# Patient Record
Sex: Male | Born: 1960 | Hispanic: No | Marital: Married | Smoking: Former smoker
Health system: Southern US, Community
[De-identification: ages and names within clinical notes are randomized; demographics above are authoritative.]

## PROBLEM LIST (undated history)

## (undated) DIAGNOSIS — T7840XA Allergy, unspecified, initial encounter: Secondary | ICD-10-CM

## (undated) DIAGNOSIS — M1990 Unspecified osteoarthritis, unspecified site: Secondary | ICD-10-CM

## (undated) DIAGNOSIS — M359 Systemic involvement of connective tissue, unspecified: Secondary | ICD-10-CM

## (undated) HISTORY — DX: Allergy, unspecified, initial encounter: T78.40XA

## (undated) HISTORY — DX: Unspecified osteoarthritis, unspecified site: M19.90

## (undated) HISTORY — PX: APPENDECTOMY: SHX54

## (undated) HISTORY — PX: SPINE SURGERY: SHX786

## (undated) HISTORY — PX: NECK SURGERY: SHX720

---

## 1997-11-28 ENCOUNTER — Encounter: Admission: RE | Admit: 1997-11-28 | Discharge: 1997-11-28 | Payer: Self-pay | Admitting: Family Medicine

## 1997-12-12 ENCOUNTER — Encounter: Admission: RE | Admit: 1997-12-12 | Discharge: 1997-12-12 | Payer: Self-pay | Admitting: Family Medicine

## 1997-12-14 ENCOUNTER — Encounter: Admission: RE | Admit: 1997-12-14 | Discharge: 1997-12-14 | Payer: Self-pay | Admitting: Family Medicine

## 2003-05-06 ENCOUNTER — Encounter: Admission: RE | Admit: 2003-05-06 | Discharge: 2003-05-06 | Payer: Self-pay | Admitting: Internal Medicine

## 2003-05-06 ENCOUNTER — Encounter: Payer: Self-pay | Admitting: Internal Medicine

## 2008-10-17 ENCOUNTER — Emergency Department (HOSPITAL_COMMUNITY): Admission: EM | Admit: 2008-10-17 | Discharge: 2008-10-17 | Payer: Self-pay | Admitting: Emergency Medicine

## 2010-03-08 ENCOUNTER — Emergency Department (HOSPITAL_COMMUNITY): Admission: EM | Admit: 2010-03-08 | Discharge: 2010-03-08 | Payer: Self-pay | Admitting: Family Medicine

## 2010-04-07 ENCOUNTER — Emergency Department (HOSPITAL_COMMUNITY): Admission: EM | Admit: 2010-04-07 | Discharge: 2010-04-07 | Payer: Self-pay | Admitting: Emergency Medicine

## 2010-07-01 ENCOUNTER — Emergency Department (HOSPITAL_COMMUNITY)
Admission: EM | Admit: 2010-07-01 | Discharge: 2010-07-01 | Payer: Self-pay | Source: Home / Self Care | Admitting: Family Medicine

## 2010-08-07 ENCOUNTER — Ambulatory Visit: Admit: 2010-08-07 | Payer: Self-pay | Admitting: Nurse Practitioner

## 2010-09-15 ENCOUNTER — Inpatient Hospital Stay (INDEPENDENT_AMBULATORY_CARE_PROVIDER_SITE_OTHER)
Admission: RE | Admit: 2010-09-15 | Discharge: 2010-09-15 | Disposition: A | Discharge status: Home / Self Care | Payer: Self-pay | Source: Ambulatory Visit | Attending: Family Medicine | Admitting: Family Medicine

## 2010-09-15 DIAGNOSIS — IMO0002 Reserved for concepts with insufficient information to code with codable children: Secondary | ICD-10-CM

## 2010-10-04 LAB — CBC
HCT: 37.5 % — ABNORMAL LOW (ref 39.0–52.0)
Hemoglobin: 12.9 g/dL — ABNORMAL LOW (ref 13.0–17.0)
MCH: 28.5 pg (ref 26.0–34.0)
MCHC: 34.4 g/dL (ref 30.0–36.0)
MCV: 83 fL (ref 78.0–100.0)
Platelets: 198 10*3/uL (ref 150–400)
RBC: 4.52 MIL/uL (ref 4.22–5.81)
RDW: 12.6 % (ref 11.5–15.5)
WBC: 5.8 10*3/uL (ref 4.0–10.5)

## 2010-10-04 LAB — DIFFERENTIAL
Basophils Absolute: 0 10*3/uL (ref 0.0–0.1)
Basophils Relative: 0 % (ref 0–1)
Eosinophils Absolute: 0.3 10*3/uL (ref 0.0–0.7)
Eosinophils Relative: 6 % — ABNORMAL HIGH (ref 0–5)
Lymphocytes Relative: 36 % (ref 12–46)
Lymphs Abs: 2.1 10*3/uL (ref 0.7–4.0)
Monocytes Absolute: 0.7 10*3/uL (ref 0.1–1.0)
Monocytes Relative: 12 % (ref 3–12)
Neutro Abs: 2.7 10*3/uL (ref 1.7–7.7)
Neutrophils Relative %: 46 % (ref 43–77)

## 2010-10-04 LAB — D-DIMER, QUANTITATIVE: D-Dimer, Quant: <0.22 ug/mL-FEU (ref 0.00–0.48)

## 2010-10-04 LAB — BASIC METABOLIC PANEL
BUN: 10 mg/dL (ref 6–23)
CO2: 29 mEq/L (ref 19–32)
Calcium: 9.1 mg/dL (ref 8.4–10.5)
Chloride: 104 mEq/L (ref 96–112)
Creatinine, Ser: 0.94 mg/dL (ref 0.4–1.5)
GFR calc Af Amer: >60 mL/min (ref >60)
GFR calc non Af Amer: >60 mL/min (ref >60)
Glucose, Bld: 100 mg/dL — ABNORMAL HIGH (ref 70–99)
Potassium: 3.8 mEq/L (ref 3.5–5.1)
Sodium: 137 mEq/L (ref 135–145)

## 2010-10-04 LAB — CK TOTAL AND CKMB (NOT AT ARMC)
CK, MB: 1.9 ng/mL (ref 0.3–4.0)
Relative Index: 0.5 (ref 0.0–2.5)
Total CK: 355 U/L — ABNORMAL HIGH (ref 7–232)

## 2010-10-05 LAB — POCT I-STAT, CHEM 8
Calcium, Ion: 1.21 mmol/L (ref 1.12–1.32)
Creatinine, Ser: 1 mg/dL (ref 0.4–1.5)
Glucose, Bld: 92 mg/dL (ref 70–99)
HCT: 46 % (ref 39.0–52.0)
Hemoglobin: 15.6 g/dL (ref 13.0–17.0)

## 2011-02-03 ENCOUNTER — Inpatient Hospital Stay (INDEPENDENT_AMBULATORY_CARE_PROVIDER_SITE_OTHER)
Admission: RE | Admit: 2011-02-03 | Discharge: 2011-02-03 | Disposition: A | Discharge status: Home / Self Care | Payer: Self-pay | Source: Ambulatory Visit | Attending: Emergency Medicine | Admitting: Emergency Medicine

## 2011-02-03 DIAGNOSIS — D179 Benign lipomatous neoplasm, unspecified: Secondary | ICD-10-CM

## 2011-02-03 DIAGNOSIS — R6889 Other general symptoms and signs: Secondary | ICD-10-CM

## 2011-09-03 ENCOUNTER — Ambulatory Visit (INDEPENDENT_AMBULATORY_CARE_PROVIDER_SITE_OTHER): Payer: BC Managed Care – PPO | Admitting: Physician Assistant

## 2011-09-03 DIAGNOSIS — M6282 Rhabdomyolysis: Secondary | ICD-10-CM

## 2011-09-03 DIAGNOSIS — M542 Cervicalgia: Secondary | ICD-10-CM

## 2011-09-03 DIAGNOSIS — M62838 Other muscle spasm: Secondary | ICD-10-CM

## 2011-09-03 DIAGNOSIS — IMO0001 Reserved for inherently not codable concepts without codable children: Secondary | ICD-10-CM

## 2011-09-03 DIAGNOSIS — L03019 Cellulitis of unspecified finger: Secondary | ICD-10-CM

## 2011-09-03 MED ORDER — MELOXICAM 7.5 MG PO TABS: 7.5000 mg | ORAL_TABLET | Freq: Every day | ORAL | Status: DC

## 2011-09-03 MED ORDER — DOXYCYCLINE HYCLATE 100 MG PO TABS: 100.0000 mg | ORAL_TABLET | Freq: Two times a day (BID) | ORAL | Status: AC

## 2011-09-03 MED ORDER — CYCLOBENZAPRINE HCL 5 MG PO TABS: 5.0000 mg | ORAL_TABLET | Freq: Three times a day (TID) | ORAL | Status: AC | PRN

## 2011-09-03 NOTE — Patient Instructions (Addendum)
Cervical Sprain and Strain °A cervical sprain is an injury to the neck. The injury can include either over-stretching or even small tears in the ligaments that hold the bones of the neck in place. A strain affects muscles and tendons. Minor injuries usually only involve ligaments and muscles. Because the different parts of the neck are so close together, more severe injuries can involve both sprain and strain. These injuries can affect the muscles, ligaments, tendons, discs, and nerves in the neck. °CAUSES  °An injury may be the result of a direct blow or from certain habits that can lead to the symptoms noted above. °· Injury from:  °· Contact sports (such as football, rugby, wrestling, hockey, auto racing, gymnastics, diving, martial arts, and boxing).  °· Motor vehicle accidents.  °· Whiplash injuries (see image at right). These are common. They occur when the neck is forcefully whipped or forced backward and/or forward.  °· Falls.  °· Lifestyle or awkward postures:  °· Cradling a telephone between the ear and shoulder.  °· Sitting in a chair that offers no support.  °· Working at an ill-designed computer station.  °· Activities that require hours of repeated or long periods of looking up (stretching the neck backward) or looking down (bending the head/neck forward).  °SYMPTOMS  °· Pain, soreness, stiffness, or burning sensation in the front, back, or sides of the neck. This may develop immediately after injury. Onset of discomfort may also develop slowly and not begin for 24 hours or more.  °· Shoulder and/or upper back pain.  °· Limits to the normal movement of the neck.  °· Headache.  °· Dizziness.  °· Weakness and/or abnormal sensation (such as numbness or tingling) of one or both arms and/or hands.  °· Muscle spasm.  °· Difficulty with swallowing or chewing.  °· Tenderness and swelling at the injury site.  °DIAGNOSIS  °Most of the time, your caregiver can diagnose this problem with a careful history and  examination. The history will include information about known problems (such as arthritis in the neck) or a previous neck injury. X-rays may be ordered to find out if there is a different problem. X-rays can also help to find problems with the bones of the neck not related to the injury or current symptoms. °TREATMENT  °Several treatment options are available to help pain, spasm, and other symptoms. They include: °· Cold helps relieve pain and reduce inflammation. Cold should be applied for 10 to 15 minutes every 2 to 3 hours after any activity that aggravates your symptoms. Use ice packs or an ice massage. Place a towel or cloth in between your skin and the ice pack.  °· Medication:  °· Only take over-the-counter or prescription medicines for pain, discomfort, or fever as directed by your caregiver.  °· Pain relievers or muscle relaxants may be prescribed. Use only as directed and only as much as you need.  °· Change in the activity that caused the problem. This might include using a headset with a telephone so that the phone is not propped between your ear and shoulder.  °· Neck collar. Your caregiver may recommend temporary use of a soft cervical collar.  °· Work station. Changes may be needed in your work place. A better sitting position and/or better posture during work may be part of your treatment.  °· Physical Therapy. Your caregiver may recommend physical therapy. This can include instructions in the use of stretching and strengthening exercises. Improvement in posture is important.   Exercises and posture training can help stabilize the neck and strengthen muscles and keep symptoms from returning.  °HOME CARE INSTRUCTIONS  °Other than formal physical therapy, all treatments above can be done at home. Even when not at work, it is important to be conscious of your posture and of activities that can cause a return of symptoms. °Most cervical sprains and/or strains are better in 1-3 weeks. As you improve and  increase activities, doing a warm up and stretching before the activity will help prevent recurrent problems. °SEEK MEDICAL CARE IF:  °· Pain is not effectively controlled with medication.  °· You feel unable to decrease pain medication over time as planned.  °· Activity level is not improving as planned and/or expected.  °SEEK IMMEDIATE MEDICAL CARE IF:  °· While using medication, you develop any bleeding, stomach upset, or signs of an allergic reaction.  °· Symptoms get worse, become intolerable, and are not helped by medications.  °· New, unexplained symptoms develop.  °· You experience numbness, tingling, weakness, or paralysis of any part of your body.  °MAKE SURE YOU:  °· Understand these instructions.  °· Will watch your condition.  °· Will get help right away if you are not doing well or get worse.  °Document Released: 05/05/2007 Document Revised: 03/20/2011 Document Reviewed: 05/05/2007 °[employer]® Patient Information ©2012 [employer]. °

## 2011-09-03 NOTE — Progress Notes (Signed)
  Subjective:    Patient ID: Jay Osborn, male    DOB: 04/01/1961, 51 y.o.   MRN: [id number]  Neck Pain  This is a chronic problem. The current episode started more than 1 month ago. The problem occurs constantly. The problem has been unchanged. Associated with: works with Scientist, research (life sciences). The pain is present in the left side. The quality of the pain is described as aching. The pain is moderate. The symptoms are aggravated by bending and position. The pain is same all the time. Pertinent negatives include no tingling. He has tried nothing for the symptoms. The treatment provided no relief.   Pt has noticed L trapezius pain for over 6 months.  It is worse after working but never really goes away, now having pain in his vertebrae near the area of pain.  He works with Fish farm manager.  He has tried no meds.  No paresthesias or weakness in the L arm.  He is R hand dominate.  No specific injury he knows of.  He also has an infection in the medial aspect of his R 4th digit around the nail after he got poked at his proximal nail fold.  He has noticed increased pain and what looks like pus collection.    Review of Systems  HENT: Positive for neck pain.   Neurological: Negative for tingling.       Objective:   Physical Exam  Constitutional: He is oriented to person, place, and time. He appears well-developed and well-nourished.  HENT:  Head: Normocephalic and atraumatic.  Right Ear: External ear normal.  Left Ear: External ear normal.  Pulmonary/Chest: Effort normal.  Musculoskeletal:       Cervical back: He exhibits tenderness, bony tenderness, pain and spasm (L trapezius area swollen and TTP). He exhibits normal range of motion.       Back:       Hands: Neurological: He is alert and oriented to person, place, and time. He has normal strength and normal reflexes. No sensory deficit.  Reflex Scores:      Tricep reflexes are 2+ on the left  side.      Bicep reflexes are 2+ on the right side and 2+ on the left side.      Brachioradialis reflexes are 2+ on the right side and 2+ on the left side. Skin: Skin is warm and dry.    Procedure: ETOH swab - 18g needle to I&D with yellow purulent d/c, sent for wound cx.      Assessment & Plan:   1. Paronychia of fourth finger of right hand  doxycycline (VIBRA-TABS) 100 MG tablet, Wound culture  2. Spasm of muscle  cyclobenzaprine (FLEXERIL) 5 MG tablet, meloxicam (MOBIC) 7.5 MG tablet  3. Neck pain on left side  cyclobenzaprine (FLEXERIL) 5 MG tablet, meloxicam (MOBIC) 7.5 MG tablet   Pt to use warm compresses for finger.

## 2011-09-06 LAB — WOUND CULTURE

## 2011-11-11 ENCOUNTER — Ambulatory Visit (INDEPENDENT_AMBULATORY_CARE_PROVIDER_SITE_OTHER): Payer: BC Managed Care – PPO | Admitting: Internal Medicine

## 2011-11-11 ENCOUNTER — Ambulatory Visit: Payer: BC Managed Care – PPO

## 2011-11-11 VITALS — BP 125/79 | HR 91 | Temp 98.7°F | Resp 16 | Ht 68.0 in | Wt 155.8 lb

## 2011-11-11 DIAGNOSIS — M542 Cervicalgia: Secondary | ICD-10-CM

## 2011-11-11 DIAGNOSIS — M5030 Other cervical disc degeneration, unspecified cervical region: Secondary | ICD-10-CM

## 2011-11-11 DIAGNOSIS — M546 Pain in thoracic spine: Secondary | ICD-10-CM

## 2011-11-11 MED ORDER — MELOXICAM 15 MG PO TABS: 15.0000 mg | ORAL_TABLET | Freq: Every day | ORAL | Status: DC
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
## 2011-11-11 NOTE — Patient Instructions (Signed)
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 Yevette Edwards for further care/take your x-rays when you go Where a cervical collar for relief of pain while you're sitting to work or driving

## 2012-03-22 ENCOUNTER — Ambulatory Visit (INDEPENDENT_AMBULATORY_CARE_PROVIDER_SITE_OTHER): Payer: BC Managed Care – PPO | Admitting: Emergency Medicine

## 2012-03-22 ENCOUNTER — Ambulatory Visit: Payer: BC Managed Care – PPO

## 2012-03-22 VITALS — BP 112/80 | HR 82 | Temp 98.9°F | Resp 16 | Ht 68.0 in | Wt 153.6 lb

## 2012-03-22 DIAGNOSIS — M545 Low back pain, unspecified: Secondary | ICD-10-CM

## 2012-03-22 LAB — POCT UA - MICROSCOPIC ONLY
Bacteria, U Microscopic: NEGATIVE
Casts, Ur, LPF, POC: NEGATIVE
Crystals, Ur, HPF, POC: NEGATIVE
Epithelial cells, urine per micros: NEGATIVE
Yeast, UA: NEGATIVE

## 2012-03-22 MED ORDER — MELOXICAM 15 MG PO TABS: 15.0000 mg | ORAL_TABLET | Freq: Every day | ORAL | Status: AC

## 2012-03-22 MED ORDER — CYCLOBENZAPRINE HCL 10 MG PO TABS: ORAL_TABLET | ORAL | Status: DC

## 2012-03-22 NOTE — Progress Notes (Signed)
  Subjective:    Patient ID: Jay Osborn, male    DOB: Apr 26, 1961, 51 y.o.   MRN: [id number]  HPI patient states that one week ago he developed severe discomfort in his low back. He denies being sexually active with his wife and had been sexually inactive for approximately 6 months. He recently had surgery by Dr. Marissa Nestle for cervical disease. He denies any radicular symptoms down his legs. He denies any weakness in his legs. His pain seems to be localized over the mid to lower lumbar spine.    Review of Systems     Objective:   Physical Exam there is no CVA pain. There is tenderness to palpation in the paralumbar muscles from L2-L5 bilaterally . Motor strength is 5 out of 5 all muscle groups. His prostate exam reveals a normal-sized prostate lobes are smooth there are no nodules palpable.  [facility] reading (PRIMARY) by  Dr. Cleta Alberts these no abnormality seen of the LS-spine. Results for orders placed in visit on 03/22/12  POCT UA - MICROSCOPIC ONLY      Component Value Range   WBC, Ur, HPF, POC 0-2     RBC, urine, microscopic 0-2     Bacteria, U Microscopic NEG     Mucus, UA SMALL     Epithelial cells, urine per micros NEG     Crystals, Ur, HPF, POC NEG     Casts, Ur, LPF, POC NEG     Yeast, UA NEG           Assessment & Plan:  This appears to be an LS strain. We will treat with Mobic and muscle relaxers. He was given a back manual.

## 2013-03-23 ENCOUNTER — Other Ambulatory Visit: Payer: Self-pay | Admitting: Physician Assistant

## 2013-03-23 ENCOUNTER — Ambulatory Visit (INDEPENDENT_AMBULATORY_CARE_PROVIDER_SITE_OTHER): Payer: BC Managed Care – PPO | Admitting: Internal Medicine

## 2013-03-23 VITALS — BP 118/70 | HR 68 | Temp 98.2°F | Resp 18 | Ht 68.5 in | Wt 151.7 lb

## 2013-03-23 DIAGNOSIS — L821 Other seborrheic keratosis: Secondary | ICD-10-CM

## 2013-03-23 DIAGNOSIS — L299 Pruritus, unspecified: Secondary | ICD-10-CM

## 2013-03-23 LAB — COMPREHENSIVE METABOLIC PANEL
Albumin: 4.4 g/dL (ref 3.5–5.2)
BUN: 10 mg/dL (ref 6–23)
Calcium: 9.3 mg/dL (ref 8.4–10.5)
Chloride: 102 mEq/L (ref 96–112)
Creat: 1.14 mg/dL (ref 0.50–1.35)
Glucose, Bld: 85 mg/dL (ref 70–99)
Potassium: 4.2 mEq/L (ref 3.5–5.3)

## 2013-03-23 LAB — CBC WITH DIFFERENTIAL/PLATELET
Eosinophils Relative: 5 % (ref 0–5)
HCT: 40.3 % (ref 39.0–52.0)
Hemoglobin: 14.2 g/dL (ref 13.0–17.0)
Lymphocytes Relative: 40 % (ref 12–46)
MCHC: 35.2 g/dL (ref 30.0–36.0)
MCV: 82.1 fL (ref 78.0–100.0)
Monocytes Absolute: 0.5 10*3/uL (ref 0.1–1.0)
Monocytes Relative: 10 % (ref 3–12)
Neutro Abs: 2.2 10*3/uL (ref 1.7–7.7)
RDW: 13.1 % (ref 11.5–15.5)
WBC: 4.8 10*3/uL (ref 4.0–10.5)

## 2013-03-23 NOTE — Patient Instructions (Addendum)
I will let you know when your lab work and pathology report are back, and what, if anything, we need to do at that time.  For now, treat the itching with Zyrtec or Claritin (these will not make you drowsy, so ok to use when working), and take Benadryl before you go to sleep.

## 2013-03-23 NOTE — Progress Notes (Signed)
Subjective:    Patient ID: Oris Drone, male    DOB: [birth year], 52 y.o.   MRN: [id number]  HPI   Mr. Kloster is a very pleasant 52 yr old male here with concern for illness.  He has been itchy for the last week - primarily back, abdomen, chest, arms.  His wife was going to apply otc itch cream to his back but noticed lots of small dark bumps across these areas.  Pt reports they look like moles but are all new within the last week.  He denies pain.  The individual lesions are not necessarily itchy, but his skin in those areas is itchy.  He ended up not applying the itch cream due to wanting to have the bumps checked out first.  He has not used anything for itching.  No one else has similar symptoms.  He denies any associated symptoms including fever, chills, cough, weight loss, abd pain, NVD.  He has no other involved skin.  There is no involvement of palms or soles.  No oral involvement.  He works 3rd shift so is typically sleeping during the day - no sun exposure.  He denies insect bites and reports the areas do not look like bites.  He denies contact with poison ivy or other plants.  Denies new soaps, detergents, lotions, clothing.  Denies any known allergies.  Not using any medications currently.  Reports that he has been in good health.  He is a former smoker.   Review of Systems  Constitutional: Negative for fever, chills, activity change, appetite change, fatigue and unexpected weight change.  HENT: Negative.   Respiratory: Negative.   Cardiovascular: Negative.   Gastrointestinal: Negative.   Skin:       Itching, dark spots  Neurological: Negative.        Objective:   Physical Exam  Vitals reviewed. Constitutional: He is oriented to person, place, and time. He appears well-developed and well-nourished. No distress.  HENT:  Head: Normocephalic and atraumatic.  Neck: Neck supple. No thyromegaly present.  Cardiovascular: Normal rate, regular rhythm and normal heart sounds.    Pulmonary/Chest: Effort normal and breath sounds normal. He has no wheezes. He has no rales.  Abdominal: Soft. Bowel sounds are normal. There is no tenderness.  Lymphadenopathy:    He has no cervical adenopathy.  Neurological: He is alert and oriented to person, place, and time.  Skin: Skin is warm and dry.  Multiple dark lesions scattered across back, chest, abdomen, sides, and face; approx 1-3mm in size; few lesions are slightly raised with stuck on appearance - appear c/w with seborrheic keratoses; no vesicles or pustules; no erythema; no involvement of lower extremities, palms, or soles  Psychiatric: He has a normal mood and affect. His behavior is normal.        Assessment & Plan:  Seborrheic keratoses - Plan: CBC with Differential, Comprehensive metabolic panel, TSH, Dermatology pathology  Pruritus - Plan: CBC with Differential, Comprehensive metabolic panel, TSH, Dermatology pathology    Mr. Cruickshank is a very pleasant 52 yr old male with 1 week of generalized pruritus associated with the development of multiple new seborrheic keratoses.  He otherwise feels well and exam is otherwise normal.  The sudden appearance of the lesions is somewhat concerning for underlying systemic illness or malignancy.  Will do CBC, CMP, TSH today.  I have also shaved one lesion and sent for derm path.  Will follow up on labs and pathology and determine further work up from there.  Pt understands and is in agreement with this plan. In the mean time, can use otc antihistamines for itch relief.     Case discussed with Dr. Merla Riches   I evaluated this patient including examining his rash/the etiology is not clear and is concerning that the rash appeared so abruptly over such an extensive surface I have reviewed and agree with documentation. Robert P. Merla Riches, M.D.

## 2013-03-24 ENCOUNTER — Telehealth: Payer: Self-pay | Admitting: Physician Assistant

## 2013-03-24 LAB — TSH: TSH: 3.168 u[IU]/mL (ref 0.350–4.500)

## 2013-03-24 NOTE — Telephone Encounter (Signed)
LMTCB to discuss labs.

## 2013-03-25 ENCOUNTER — Telehealth: Payer: Self-pay | Admitting: Physician Assistant

## 2013-03-25 ENCOUNTER — Telehealth: Payer: Self-pay

## 2013-03-25 LAB — PSA: PSA: 1.69 ng/mL (ref <=4.00)

## 2013-03-25 NOTE — Telephone Encounter (Signed)
Tried listed home number again, goes straight to VM with no identifying information - LMTCB again.  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

## 2013-03-25 NOTE — Telephone Encounter (Signed)
Error

## 2013-03-25 NOTE — Telephone Encounter (Signed)
Tried to call pt again re: labs and coming back in to see me.  No answer at home number.  No answer at work number.  Will try again later today

## 2013-06-01 ENCOUNTER — Ambulatory Visit (INDEPENDENT_AMBULATORY_CARE_PROVIDER_SITE_OTHER): Payer: BC Managed Care – PPO | Admitting: Family Medicine

## 2013-06-01 ENCOUNTER — Ambulatory Visit: Payer: BC Managed Care – PPO

## 2013-06-01 VITALS — BP 118/76 | HR 66 | Temp 98.0°F | Resp 16 | Ht 69.5 in | Wt 157.0 lb

## 2013-06-01 DIAGNOSIS — R3129 Other microscopic hematuria: Secondary | ICD-10-CM

## 2013-06-01 DIAGNOSIS — R109 Unspecified abdominal pain: Secondary | ICD-10-CM

## 2013-06-01 LAB — POCT CBC
HCT, POC: 44.5 % (ref 43.5–53.7)
Hemoglobin: 13.9 g/dL — AB (ref 14.1–18.1)
Lymph, poc: 1.9 (ref 0.6–3.4)
MCH, POC: 28.3 pg (ref 27–31.2)
MCV: 90.5 fL (ref 80–97)
RBC: 4.92 M/uL (ref 4.69–6.13)
WBC: 4.6 10*3/uL (ref 4.6–10.2)

## 2013-06-01 LAB — COMPREHENSIVE METABOLIC PANEL
ALT: 10 U/L (ref 0–53)
CO2: 28 mEq/L (ref 19–32)
Calcium: 9 mg/dL (ref 8.4–10.5)
Chloride: 102 mEq/L (ref 96–112)
Creat: 0.9 mg/dL (ref 0.50–1.35)
Glucose, Bld: 85 mg/dL (ref 70–99)

## 2013-06-01 LAB — POCT URINALYSIS DIPSTICK
Bilirubin, UA: NEGATIVE
Blood, UA: NEGATIVE
Glucose, UA: NEGATIVE
Leukocytes, UA: NEGATIVE
Nitrite, UA: NEGATIVE

## 2013-06-01 LAB — POCT UA - MICROSCOPIC ONLY
Mucus, UA: POSITIVE
Yeast, UA: NEGATIVE

## 2013-06-01 MED ORDER — TAMSULOSIN HCL 0.4 MG PO CAPS: 0.4000 mg | ORAL_CAPSULE | Freq: Every day | ORAL | Status: DC

## 2013-06-01 NOTE — Patient Instructions (Signed)
Take 1 flomax fill a day for the next week or so. Use the urine filter to see if you can catch a stone.  If your pain does not get better please let me know- Sooner if worse.   I will be in touch with the rest of your labs

## 2013-06-01 NOTE — Progress Notes (Addendum)
[facility] [address], [place] [address] [phone]  Date:  06/01/2013   Name:  Jay Osborn   DOB:  11/30/1960   MRN:  [id number]  PCP:  No primary provider on file.    Chief Complaint: Flank Pain   History of Present Illness:  Jay Osborn is a 52 y.o. very pleasant male patient who presents with the following:  He has noted intermittent pain in his right side for about one week.   He works 3rd shift.  Last night he felt a 'hot" feeling and a "sting" in his right side.  His discomfort will come and go.   No dysuria.   He has not noted any hematuria.   He has not had any nausea or vomiting, he is eating ok.  No fever.  No systemic symptoms such as fever or aches.   He has never had this in the past.   He has never been dx with a kidney stone.  He has had neck surgery- no abdominal operations in the past.   He is having normal stools- had a BM this morning.    He did smoke for a little over a year in the past- he is long quit  There are no active problems to display for this patient.   No past medical history on file.  No past surgical history on file.  History  Substance Use Topics  . Smoking status: Former Games developer  . Smokeless tobacco: Not on file  . Alcohol Use: No    Family History  Problem Relation Age of Onset  . Cancer Father     No Known Allergies  Medication list has been reviewed and updated.  No current outpatient prescriptions on file prior to visit.   No current facility-administered medications on file prior to visit.    Review of Systems:  As per HPI- otherwise negative.   Physical Examination: Filed Vitals:   06/01/13 0851  BP: 118/76  Pulse: 66  Temp: 98 F (36.7 C)  Resp: 16   Filed Vitals:   06/01/13 0851  Height: 5' 9.5" (1.765 m)  Weight: 157 lb (71.215 kg)   Body mass index is 22.86 kg/(m^2). Ideal Body Weight: Weight in (lb) to have BMI = 25: 171.4  GEN: WDWN, NAD, Non-toxic, A  & O x 3, normal weight, looks well HEENT: Atraumatic, Normocephalic. Neck supple. No masses, No LAD. Ears and Nose: No external deformity. CV: RRR, No M/G/R. No JVD. No thrill. No extra heart sounds. PULM: CTA B, no wheezes, crackles, rhonchi. No retractions. No resp. distress. No accessory muscle use. ABD: S, NT, ND, +BS. No rebound. No HSM. He indicates a tender area on his right side (lateral aspect of the abdomen) just under the inferior ribs. This is somewhat difficult to locate and does not appear to be overly tender.  Negative murphy sign.  No RLQ tenderness, no CBA tenderness  EXTR: No c/c/e NEURO Normal gait.  PSYCH: Normally interactive. Conversant. Not depressed or anxious appearing.  Calm demeanor.   [facility] reading (PRIMARY) by  Dr. Patsy Lager. abd series: normal.  Some increased stool in the right colon  ACUTE ABDOMEN SERIES (ABDOMEN 2 VIEW & CHEST 1 VIEW)  COMPARISON: None.  FINDINGS: There is no evidence of dilated bowel loops or free intraperitoneal air. No radiopaque calculi or other significant radiographic abnormality is seen.  Heart size and mediastinal contours are within normal limits. Both lungs are clear.  IMPRESSION: Negative abdominal  radiographs. No acute cardiopulmonary disease.   Results for orders placed in visit on 06/01/13  POCT CBC      Result Value Range   WBC 4.6  4.6 - 10.2 K/uL   Lymph, poc 1.9  0.6 - 3.4   POC LYMPH PERCENT 41.2  10 - 50 %L   MID (cbc) 0.5  0 - 0.9   POC MID % 11.1  0 - 12 %M   POC Granulocyte 2.2  2 - 6.9   Granulocyte percent 47.7  37 - 80 %G   RBC 4.92  4.69 - 6.13 M/uL   Hemoglobin 13.9 (*) 14.1 - 18.1 g/dL   HCT, POC 21.3  08.6 - 53.7 %   MCV 90.5  80 - 97 fL   MCH, POC 28.3  27 - 31.2 pg   MCHC 31.2 (*) 31.8 - 35.4 g/dL   RDW, POC 57.8     Platelet Count, POC 212  142 - 424 K/uL   MPV    0 - 99.8 fL  POCT UA - MICROSCOPIC ONLY      Result Value Range   WBC, Ur, HPF, POC 0-2     RBC, urine, microscopic 0-3      Bacteria, U Microscopic trace     Mucus, UA pos     Epithelial cells, urine per micros 0-1     Crystals, Ur, HPF, POC neg     Casts, Ur, LPF, POC neg     Yeast, UA neg    POCT URINALYSIS DIPSTICK      Result Value Range   Color, UA yellow     Clarity, UA clear     Glucose, UA neg     Bilirubin, UA neg     Ketones, UA neg     Spec Grav, UA >=1.030     Blood, UA neg     pH, UA 5.5     Protein, UA neg     Urobilinogen, UA 0.2     Nitrite, UA neg     Leukocytes, UA Negative      Assessment and Plan: Right flank pain - Plan: DG Abd Acute W/Chest, POCT CBC, Comprehensive metabolic panel, POCT UA - Microscopic Only, POCT urinalysis dipstick, tamsulosin (FLOMAX) 0.4 MG CAPS capsule  Microhematuria - Plan: Urine culture  Possible renal stone causing vague discomfort vs MSK pain.  Will start flomax, check urine culture.  Offered CT and discussed benefit of discovering any pathology vs drawback of expense and radiation.  At this time he declines CT scan.  Will strain his urine, let me know if not feeling better in a day or two.  If any worsening or other concerns he is to get help right away.   Signed Abbe Amsterdam, MD  11/16: received culture; negative.  Called and LMOM.  I will go ahead and refer to urology for evaluation.

## 2013-06-07 ENCOUNTER — Encounter: Payer: Self-pay | Admitting: Family Medicine

## 2013-06-07 NOTE — Addendum Note (Signed)
Addended by: Abbe Amsterdam C on: 06/07/2013 03:48 PM   Modules accepted: Orders

## 2013-06-22 ENCOUNTER — Ambulatory Visit (INDEPENDENT_AMBULATORY_CARE_PROVIDER_SITE_OTHER): Payer: BC Managed Care – PPO | Admitting: Family Medicine

## 2013-06-22 VITALS — BP 112/68 | HR 67 | Temp 98.6°F | Resp 17 | Ht 69.0 in | Wt 158.0 lb

## 2013-06-22 DIAGNOSIS — B370 Candidal stomatitis: Secondary | ICD-10-CM

## 2013-06-22 MED ORDER — FLUCONAZOLE 100 MG PO TABS: 100.0000 mg | ORAL_TABLET | Freq: Every day | ORAL | Status: DC

## 2013-06-22 NOTE — Patient Instructions (Signed)
Thrush, Adult  Thrush is a yeast infection that develops in the mouth and throat and on the tongue. The medical term for this is oropharyngeal candidiasis, or OPC. Thrush is most common in older adults, but it can occur at any age. Thrush occurs when a yeast called candida grows out of control. Candida normally is present in small amounts in the mouth and on other mucous membranes. However, under certain circumstances, candida can grow rapidly, causing thrush. Thrush can be a recurring problem for people who have chronic illnesses or who take medications that limit the body's ability to fight infection (weakened immune system). Since these people have difficulty fighting infections, the fungus that causes thrush can spread throughout the body. This can cause life-threatening blood or organ infections. CAUSES  Candida, the yeast that causes thrush, is normally present in small amounts in the mouth and on other mucous membranes. It usually causes no harm. However, when conditions are present that allow the yeast to grow uncontrolled, it invades surrounding tissues and becomes an infection. Thrush is most commonly caused by the yeast Candida albicans. Less often, other forms of candida can lead to thrush. There are many types of bacteria in your mouth that normally control the growth of candida. Sometimes a new type of bacteria gets into your mouth and disrupts the balance of the germs already there. This can allow candida to overgrow. Other factors that increase your risk of developing thrush include:  An impaired ability to fight infection (weakened immune system). A normal immune system is usually strong enough to prevent candida from overgrowing.  Older adults are more likely to develop thrush because they may have weaker immune systems.  People with human immunodeficiency virus (HIV) infection have a high likelihood of developing thrush. About 90% of people with HIV develop thrush at some point during  the course of their disease.  People with diabetes are more likely to get thrush because high blood sugar levels promote overgrowth of the candida fungus.  A dry mouth (xerostomia). Dry mouth can result from overuse of mouthwashes or from certain conditions such as Sjgren's syndrome.  Pregnancy. Hormone changes during pregnancy can lead to thrush by altering the balance of bacteria in the mouth.  Poor dental care, especially in people who have false teeth.  The use of antibiotic medications. This may lead to thrush by changing the balance of bacteria in the mouth. SYMPTOMS  Thrush can be a mild infection that causes no symptoms. If symptoms develop, they may include the following:  A burning feeling in the mouth and throat. This can occur at the start of a thrush infection.  White patches that adhere to the mouth and tongue. The tissue around the patches may be red, raw, and painful. If rubbed (during tooth brushing, for example), the patches and the tissue of the mouth may bleed easily.  A bad taste in the mouth or difficulty tasting foods.  Cottony feeling in the mouth.  Sometimes pain during eating and swallowing. DIAGNOSIS  Your caregiver can usually diagnose thrush by exam. In addition to looking in your mouth, your caregiver will ask you questions about your health. TREATMENT  Medications that help prevent the growth of fungi (antifungals) are the standard treatment for thrush. These medications are either applied directly to the affected area (topical) or swallowed (oral). Mild thrush In adults, mild cases of thrush may clear up with simple treatment that can be done at home. This treatment usually involves using an antifungal mouth   rinse or lozenges. Treatment usually lasts about 14 days. Moderate to severe thrush  More severe thrush infections that have spread to the esophagus are treated with an oral antifungal medication. A topical antifungal medication may also be  used.  For some severe infections, a treatment period longer than 14 days may be needed.  Oral antifungal medications are almost never used during pregnancy because the fetus may be harmed. However, if a pregnant woman has a rare, severe thrush infection that has spread to her blood, oral antifungal medications may be used. In this case, the risk of harm to the mother and fetus from the severe thrush infection may be greater than the risk posed by the use of antifungal medications. Persistent or recurrent thrush Persistent (does not go away) or recurrent (keeps coming back) cases of thrush may:  Need to be treated twice as long as the symptoms last.  Require treatment with both oral and topical antifungal medications.  People with weakened immune systems can take an antifungal medication on a continuous basis to prevent thrush infections. It is important to treat conditions that make you more likely to get thrush, such as diabetes, human immunodeficiency virus (HIV), or cancer.  HOME CARE INSTRUCTIONS   If you are breast-feeding, you should clean your nipples with an antifungal medication, such as nystatin (Mycostatin). Dry your nipples after breast-feeding. Applying lanolin-containing body lotion may help relieve nipple soreness.  If you wear dentures and get thrush, remove dentures before going to bed, brush them vigorously, and soak in a solution of chlorhexidine gluconate or a product such as Polident or Efferdent.  Eating plain, unflavored yogurt that contains live cultures (check the label) can also help cure thrush. Yogurt helps healthy bacteria grow in the mouth. These bacteria stop the growth of the yeast that causes thrush.  Adults can treat thrush at home with gentian violet (1%), a dye that kills bacteria and fungi. It is available without a prescription. If there is no known cause for the infection or if gentian violet does not cure the thrush, you need to see your  caregiver. Comfort measures Measures can be taken to reduce the discomfort of thrush:  Drink cold liquids such as water or iced tea. Eat flavored ice treats or frozen juices.  Eat foods that are easy to swallow such as gelatin, ice cream, or custard.  If the patches are painful, try drinking from a straw.  Rinse your mouth several times a day with a warm saltwater rinse. You can make the saltwater mixture with 1 tsp (5 g) of salt in 8 fl oz (0.2 L) of warm water. PROGNOSIS   Most cases of thrush are mild and clear up with the use of an antifungal mouth rinse or lozenges. Very mild cases of thrush may clear up without medical treatment. It usually takes about 14 days of treatment with an oral antifungal medication to cure more severe thrush infections. In some cases, thrush may last several weeks even with treatment.  If thrush goes untreated and does not go away by itself, it can spread to other parts of the body.  Thrush can spread to the throat, the vagina, or the skin. It rarely spreads to other organs of the body. Thrush is more likely to recur (come back) in:  People who use inhaled corticosteroids to treat asthma.  People who take antibiotic medications for a long time.  People who have false teeth.  People who have a weakened immune system. RISKS AND   COMPLICATIONS Complications related to thrush are rare in healthy people. There are several factors that can increase your risk of developing thrush. Age Older adults, especially those who have serious health problems, are more likely to develop thrush because their immune systems are likely to be weaker. Behavior  The yeast that causes thrush can be spread by oral sex.  Heavy smoking can lower the body's ability to fight off infections. This makes thrush more likely to develop. Other conditions  False teeth (dentures), braces, or a retainer that irritates the mouth make it hard to keep the mouth clean. An unclean mouth is  more likely to develop thrush than a clean mouth.  People with a weakened immune system, such as those who have diabetes or human immunodeficiency virus (HIV) or who are undergoing chemotherapy, have an increased risk for developing thrush. Medications Some medications can allow the fungus that causes thrush to grow uncontrolled. Common ones are:  Antibiotics, especially those that kill a wide range of organisms (broad-spectrum antibiotics), such as tetracycline commonly can cause thrush.  Birth control pills (oral contraceptives).  Medications that weaken the body's immune system, such as corticosteroids. Environment Exposure over time to certain environmental chemicals, such as benzene and pesticides, can weaken the body's immune system. This increases your risk for developing infections, including thrush. SEEK IMMEDIATE MEDICAL CARE IF:  Your symptoms are getting worse or are not improving within 7 days of starting treatment.  You have symptoms of spreading infection, such as white patches on the skin outside of the mouth.  You are nursing and you have redness and pain in the nipples in spite of home treatment or if you have burning pain in the nipple area when you nurse. Your baby's mouth should also be examined to determine whether thrush is causing your symptoms. Document Released: 04/02/2004 Document Revised: 09/30/2011 Document Reviewed: 02/08/2013 [employer] Patient Information 2014 [employer].  

## 2013-06-22 NOTE — Progress Notes (Signed)
52 yo man who works third shift at [employer].  He complains of two weeks of left sided buccal mucosa soreness and white rash.  From [place]  He stopped using tobacco several years ago.  Objective: NAD Left sided oral mucosa has several superficial white markings that could be thrush or leukoplakia  Assessment: needs to make sure that this resolves  Plan:  Recheck Dec 8 Diflucan 100 qd.  Signed, Elvina Sidle, MD

## 2013-06-28 ENCOUNTER — Ambulatory Visit (INDEPENDENT_AMBULATORY_CARE_PROVIDER_SITE_OTHER): Payer: BC Managed Care – PPO | Admitting: Family Medicine

## 2013-06-28 ENCOUNTER — Other Ambulatory Visit: Payer: Self-pay | Admitting: Otolaryngology

## 2013-06-28 VITALS — BP 100/68 | HR 64 | Temp 98.0°F | Resp 16 | Ht 69.0 in | Wt 156.0 lb

## 2013-06-28 DIAGNOSIS — K1321 Leukoplakia of oral mucosa, including tongue: Secondary | ICD-10-CM

## 2013-06-28 NOTE — Progress Notes (Signed)
Patient ID: Jay Osborn, male   DOB: 1961/06/27, 52 y.o.   MRN: [id number]   Patient ID: Jay Osborn MRN: [id number], DOB: 1960-08-25, 52 y.o. Date of Encounter: 06/28/2013, 9:05 AM  Primary Physician: No primary provider on file.  Chief Complaint: mouth sore  HPI: 52 y.o. year old male with history below presents with history below presents with a mouth sore on his inner right cheek onset 3 weeks ago. Pt states the sore is painful. Pt states that the sore doesn't irritate him too much unless he eats spicy foods. He used chewing tobacco years ago.  Patient was treated with Diflucan one week ago but had no improvement   History reviewed. No pertinent past medical history.   Home Meds: Prior to Admission medications   Medication Sig Start Date End Date Taking? Authorizing Provider  fluconazole (DIFLUCAN) 100 MG tablet Take 1 tablet (100 mg total) by mouth daily. 06/22/13   Elvina Sidle, MD  tamsulosin (FLOMAX) 0.4 MG CAPS capsule Take 1 capsule (0.4 mg total) by mouth daily. 06/01/13   Pearline Cables, MD    Allergies: No Known Allergies  History   Social History  . Marital Status: Married    Spouse Name: N/A    Number of Children: N/A  . Years of Education: N/A   Occupational History  . Not on file.   Social History Main Topics  . Smoking status: Former Games developer  . Smokeless tobacco: Not on file  . Alcohol Use: No  . Drug Use: No  . Sexual Activity: Yes   Other Topics Concern  . Not on file   Social History Narrative  . No narrative on file     Review of Systems: Constitutional: negative for chills, fever, night sweats, weight changes, or fatigue  HEENT: negative for vision changes, hearing loss, congestion, rhinorrhea, ST, epistaxis, or sinus pressure Cardiovascular: negative for chest pain or palpitations Respiratory: negative for hemoptysis, wheezing, shortness of breath, or cough Abdominal: negative for abdominal pain, nausea, vomiting, diarrhea, or constipation Dermatological:  negative for rash Neurologic: negative for headache, dizziness, or syncope All other systems reviewed and are otherwise negative with the exception to those above and in the HPI.   Physical Exam: Blood pressure 100/68, pulse 64, temperature 98 F (36.7 C), temperature source Oral, resp. rate 16, height 5\' 9"  (1.753 m), weight 156 lb (70.761 kg), SpO2 100.00%., Body mass index is 23.03 kg/(m^2). General: Well developed, well nourished, in no acute distress. Head: Normocephalic, atraumatic, eyes without discharge, sclera non-icteric, nares are without discharge. Bilateral auditory canals clear, TM's are without perforation, pearly grey and translucent with reflective cone of light bilaterally. Oral cavity moist, posterior pharynx without exudate, erythema, peritonsillar abscess, or post nasal drip.  Neck: Supple. No thyromegaly. Full ROM. No lymphadenopathy. Lungs: Clear bilaterally to auscultation without wheezes, rales, or rhonchi. Breathing is unlabored. Heart: RRR with S1 S2. No murmurs, rubs, or gallops appreciated. Abdomen: Soft, non-tender, non-distended with normoactive bowel sounds. No hepatomegaly. No rebound/guarding. No obvious abdominal masses. Msk:  Strength and tone normal for age. Extremities/Skin: Warm and dry. No clubbing or cyanosis. No edema. No rashes or suspicious lesions. Neuro: Alert and oriented X 3. Moves all extremities spontaneously. Gait is normal. CNII-XII grossly in tact. Psych:  Responds to questions appropriately with a normal affect.    ASSESSMENT AND PLAN:  52 y.o. year old male with unusual skin change left buccal mucosa c/w leukoplakia. with unusual skin change left buccal mucosa c/w leukoplakia.  Needs further evaluation since the Diflucan made no difference, and patient has h/o tobacco use.  Leukoplakia of oral mucosa/tongue - Plan: Ambulatory referral to ENT     Signed, Elvina Sidle, MD 06/28/2013 9:05 AM

## 2013-06-28 NOTE — Patient Instructions (Addendum)
Dr Emeline Darling at [facility] can see you today at 1:45 the address is [address] The phone number is [phone]   Patient ID: Jay Osborn, male   DOB: 1960/08/07, 52 y.o.   MRN: [id number]   Patient ID: Jay Osborn MRN: [id number], DOB: 02/10/1961, 52 y.o. Date of Encounter: 06/28/2013, 9:05 AM  Primary Physician: No primary provider on file.  Chief Complaint: mouth sore  HPI: 52 y.o. year old male with history below presents with a mouth sore on his inner right cheek onset 3 weeks ago. Pt states the sore is painful. Pt states that the sore doesn't irritate him too much unless he eats spicy foods. He used chewing tobacco years ago.  Patient was treated with Diflucan one week ago but had no improvement   History reviewed. No pertinent past medical history.   Home Meds: Prior to Admission medications   Medication Sig Start Date End Date Taking? Authorizing Provider  fluconazole (DIFLUCAN) 100 MG tablet Take 1 tablet (100 mg total) by mouth daily. 06/22/13   Elvina Sidle, MD  tamsulosin (FLOMAX) 0.4 MG CAPS capsule Take 1 capsule (0.4 mg total) by mouth daily. 06/01/13   Pearline Cables, MD    Allergies: No Known Allergies  History   Social History  . Marital Status: Married    Spouse Name: N/A    Number of Children: N/A  . Years of Education: N/A   Occupational History  . Not on file.   Social History Main Topics  . Smoking status: Former Games developer  . Smokeless tobacco: Not on file  . Alcohol Use: No  . Drug Use: No  . Sexual Activity: Yes   Other Topics Concern  . Not on file   Social History Narrative  . No narrative on file     Review of Systems: Constitutional: negative for chills, fever, night sweats, weight changes, or fatigue  HEENT: negative for vision changes, hearing loss, congestion, rhinorrhea, ST, epistaxis, or sinus pressure Cardiovascular: negative for chest pain or palpitations Respiratory: negative for hemoptysis, wheezing, shortness  of breath, or cough Abdominal: negative for abdominal pain, nausea, vomiting, diarrhea, or constipation Dermatological: negative for rash Neurologic: negative for headache, dizziness, or syncope All other systems reviewed and are otherwise negative with the exception to those above and in the HPI.   Physical Exam: Blood pressure 100/68, pulse 64, temperature 98 F (36.7 C), temperature source Oral, resp. rate 16, height 5\' 9"  (1.753 m), weight 156 lb (70.761 kg), SpO2 100.00%., Body mass index is 23.03 kg/(m^2). General: Well developed, well nourished, in no acute distress. Head: Normocephalic, atraumatic, eyes without discharge, sclera non-icteric, nares are without discharge. Bilateral auditory canals clear, TM's are without perforation, pearly grey and translucent with reflective cone of light bilaterally. Oral cavity moist, posterior pharynx without exudate, erythema, peritonsillar abscess, or post nasal drip.  Neck: Supple. No thyromegaly. Full ROM. No lymphadenopathy. Lungs: Clear bilaterally to auscultation without wheezes, rales, or rhonchi. Breathing is unlabored. Heart: RRR with S1 S2. No murmurs, rubs, or gallops appreciated. Abdomen: Soft, non-tender, non-distended with normoactive bowel sounds. No hepatomegaly. No rebound/guarding. No obvious abdominal masses. Msk:  Strength and tone normal for age. Extremities/Skin: Warm and dry. No clubbing or cyanosis. No edema. No rashes or suspicious lesions. Neuro: Alert and oriented X 3. Moves all extremities spontaneously. Gait is normal. CNII-XII grossly in tact. Psych:  Responds to questions appropriately with a normal affect.    ASSESSMENT AND PLAN:  52 y.o. year old  male with unusual skin change left buccal mucosa c/w leukoplakia.  Needs further evaluation since the Diflucan made no difference, and patient has h/o tobacco use. Leukoplakia of oral mucosa/tongue - Plan: Ambulatory referral to ENT     Signed, Elvina Sidle,  MD 06/28/2013 9:05 AM

## 2013-07-17 ENCOUNTER — Ambulatory Visit (INDEPENDENT_AMBULATORY_CARE_PROVIDER_SITE_OTHER): Payer: BC Managed Care – PPO | Admitting: Internal Medicine

## 2013-07-17 VITALS — BP 102/72 | HR 62 | Temp 98.6°F | Resp 16 | Ht 68.25 in | Wt 160.4 lb

## 2013-07-17 DIAGNOSIS — H109 Unspecified conjunctivitis: Secondary | ICD-10-CM

## 2013-07-17 MED ORDER — GENTAMICIN SULFATE 0.3 % OP SOLN: 2.0000 [drp] | Freq: Four times a day (QID) | OPHTHALMIC | Status: DC

## 2013-07-17 NOTE — Progress Notes (Signed)
   Subjective:  This chart was scribed for [name], MD by Carl Best, Medical Scribe. This patient was seen in Room 2 and the patient's care was started at 10:18 AM.    Patient ID: Jay Osborn, male    DOB: January 04, 1961, 52 y.o.   MRN: [id number]  HPI HPI Comments: Jay Osborn is a 52 y.o. male who presents to the [facility] complaining of constant right eye pain that started three days ago.  He states that he had rhinorrhea, cough, and sneezing two days ago but states that it has since subsided.  He lists eye itchiness and eye discharge in the morning as associated symptoms.  He denies visual disturbances as an associated symptom.    No past medical history on file. No past surgical history on file. Family History  Problem Relation Age of Onset  . Cancer Father    History   Social History  . Marital Status: Married    Spouse Name: N/A    Number of Children: N/A  . Years of Education: N/A   Occupational History  . Not on file.   Social History Main Topics  . Smoking status: Former Games developer  . Smokeless tobacco: Not on file  . Alcohol Use: No  . Drug Use: No  . Sexual Activity: Yes   Other Topics Concern  . Not on file   Social History Narrative  . No narrative on file   No Known Allergies  Review of Systems  Eyes: Positive for discharge and itching. Negative for visual disturbance.       Objective:   Physical Exam  HENT:  Head: Normocephalic and atraumatic.  Right Ear: External ear normal.  Left Ear: External ear normal.  Mouth/Throat: Oropharynx is clear and moist. No oropharyngeal exudate.  Eyes:  Right conjunctiva is injected with slight discharge.  Pupils are equal, round, and reactive to light and combination.    Lymphadenopathy:    He has no cervical adenopathy.     Filed Vitals:   07/17/13 0912  BP: 102/72  Pulse: 62  Temp: 98.6 F (37 C)  Resp: 16        Assessment & Plan:  Conjunctivitis  Meds  ordered this encounter  Medications  . gentamicin (GARAMYCIN) 0.3 % ophthalmic solution    Sig: Place 2 drops into the right eye 4 (four) times daily.    Dispense:  5 mL    Refill:  1

## 2013-07-19 ENCOUNTER — Ambulatory Visit (INDEPENDENT_AMBULATORY_CARE_PROVIDER_SITE_OTHER): Payer: BC Managed Care – PPO | Admitting: Internal Medicine

## 2013-07-19 VITALS — BP 100/62 | HR 64 | Temp 98.4°F | Resp 18 | Ht 68.0 in | Wt 155.0 lb

## 2013-07-19 DIAGNOSIS — S0501XD Injury of conjunctiva and corneal abrasion without foreign body, right eye, subsequent encounter: Secondary | ICD-10-CM

## 2013-07-19 DIAGNOSIS — S058X9A Other injuries of unspecified eye and orbit, initial encounter: Secondary | ICD-10-CM

## 2013-07-19 MED ORDER — OFLOXACIN 0.3 % OP SOLN: OPHTHALMIC | Status: DC

## 2013-07-19 NOTE — Patient Instructions (Addendum)
We're trying to schedule an appointment with Dr. Dione Booze or Dr. Terrial Rhodes use eyedrops as directed every 2 hours until bedtime and then every 2 hours tomorrow until you see the ophthalmologist We will call you at cell [phone]

## 2013-07-19 NOTE — Progress Notes (Signed)
  This chart was scribed for Jay Sia, MD by Joaquin Music, ED Scribe. This patient was seen in room Room/bed 8 and the patient's care was started at 3:20 PM. Subjective:    Patient ID: Jay Osborn, male    DOB: 15-Aug-1960, 52 y.o.   MRN: [id number] Chief Complaint  Patient presents with  . Follow-up    pink eye    HPI Jay Osborn is a 52 y.o. male who presents to the [facility] for F/U apt. Pt was last seen at [facility] 07/17/2013 and diagnosed with conjunctivitis. Pt was prescribed Gentamicin upon D/C. He states he has been taking his antibiotic as prescribed.  Pt states he is still having constant R eye pain of eye and lower eyelid, yellow discharge in the morning, and constant tearing and redness. He states his R eye is photophobic. Pt denies other visual disturbances. Pt denies having symptoms present in the L eye.  Pt states when he initially began having symptoms, he states he felt a bump in lower lid of R eye. He states he noticed redness and a small "bump" in his R his eye. He states "he feels something is inside his eye". He denies having family hx of eye complications.  He has recurrent problems with eyelashes pointing into his creating trouble.   There are no active problems to display for this patient.   History reviewed. No pertinent past surgical history.  Family History  Problem Relation Age of Onset  . Cancer Father    Current outpatient prescriptions:gentamicin (GARAMYCIN) 0.3 % ophthalmic solution, Place 2 drops into the right eye 4 (four) times daily., Disp: 5 mL, Rfl: 1;  fluconazole (DIFLUCAN) 100 MG tablet, Take 1 tablet (100 mg total) by mouth daily., Disp: 7 tablet, Rfl: 0;  tamsulosin (FLOMAX) 0.4 MG CAPS capsule, Take 1 capsule (0.4 mg total) by mouth daily., Disp: 30 capsule, Rfl: 3  Review of Systems Noncontributory  Objective:   Physical Exam  Eyes:     BP 100/62  Pulse 64  Temp(Src) 98.4 F (36.9 C) (Oral)  Resp 18  Ht 5\' 8"   (1.727 m)  Wt 155 lb (70.308 kg)  BMI 23.57 kg/m2  SpO2 100% Right conjunctiva is very injected No lid lesions are demonstrated although are lashes on lower right lid that are clipped at the skin margin Pupils are equal round reactive to light and accommodation extraocular movements intact opthaine plus staining demonstrates a 0.75 cm linear uptake at 8:00 over the iris Vision is intact BP 100/62  Pulse 64  Temp(Src) 98.4 F (36.9 C) (Oral)  Resp 18  Ht 5\' 8"  (1.727 m)  Wt 155 lb (70.308 kg)  BMI 23.57 kg/m2  SpO2 100% Assessment & Plan:  Problem #1 corneal lesion-? Injury/? Atypical ulcer We will cover for ulcer until ophthalmology exam with slit lamp can be established   I personally performed the services described in this documentation, which was scribed in my presence. The recorded information has been reviewed and is accurate.

## 2013-09-20 ENCOUNTER — Ambulatory Visit (INDEPENDENT_AMBULATORY_CARE_PROVIDER_SITE_OTHER): Payer: BC Managed Care – PPO | Admitting: Physician Assistant

## 2013-09-20 VITALS — BP 100/72 | HR 67 | Temp 98.0°F | Resp 18 | Ht 68.0 in | Wt 159.0 lb

## 2013-09-20 DIAGNOSIS — M25561 Pain in right knee: Secondary | ICD-10-CM

## 2013-09-20 DIAGNOSIS — M25569 Pain in unspecified knee: Secondary | ICD-10-CM

## 2013-09-20 MED ORDER — MELOXICAM 15 MG PO TABS: 15.0000 mg | ORAL_TABLET | Freq: Every day | ORAL | Status: DC

## 2013-09-20 NOTE — Patient Instructions (Signed)
Take the meloxicam once daily with food.  Continue for 2-4 weeks.  Ice your knee 1-2 times per day for 15-20 minutes at a time  Perform the exercises 1-2 times per day  If you are worsening or not improving, I think the next step would be to try physical therapy.  We could also consider referring you to an orthopedist.  Patellofemoral Pain Your exam shows your knee pain is probably due to a problem with the knee cap, the patella. This problem is also called patellofemoral pain, runner's knee, or chondromalacia. Most of the time, this problem is due to overuse of the knee joint. Repeated bending and straightening can irritate the underside of the knee cap. When this happens, activities such as running, walking, climbing, biking or jumping usually produce pain. Pain may also occur after prolonged sitting. Other patellofemoral symptoms can include joint stiffness, swelling, and a snapping or grinding sensation with movement. Rest and rehabilitation are usually successful in treating this problem. Surgery is rarely needed. Treatment includes correcting any mechanical factors that could hurt the normal working of the knee. This could be weak thigh muscles or foot problems. Avoid repetitive activities of the knee until the pain and other symptoms improve. Apply ice packs over the knee for 20 to 30 minutes every 2 to 4 hours to reduce pain and swelling. Only take over-the-counter or prescription medicines for pain, discomfort, or fever as directed by your caregiver. Knee braces or neoprene sleeves may help reduce irritation. Rehabilitation exercises to strengthen the quad muscle are often prescribed when your symptoms are better. Call your caregiver for a follow-up exam to evaluate your response to treatment. Document Released: 08/15/2004 Document Revised: 09/30/2011 Document Reviewed: 07/08/2005 [facility] Patient Information 2014 [place].

## 2013-09-20 NOTE — Progress Notes (Signed)
   Subjective:    Patient ID: Jordan Hawks, male    DOB: Oct 19, 1960, 53 y.o.   MRN: [id number]  HPI   Mr. Warwick is a very pleasant 53 yr old male here with concern for Right knee pain.  He states this has been going on for 1 month.  The pain is it the lateral/anterior aspect of the knee.  He denies trauma or injury.  He states the pain occurs only when he is seated for a long time and then stands up.  He immediately then has sharp pain in his right knee lateral and inferior to the patella.  The pain is very brief and immediately decreases as soon as he starts moving.  He denies pain with activity.  No prior knee problems.  Left knee feels well.  He denies pain in ankle or hip.  He has not tried any medication for his knee.  He otherwise feels well    Review of Systems  Constitutional: Negative for fever and chills.  Respiratory: Negative.   Cardiovascular: Negative.   Gastrointestinal: Negative.   Musculoskeletal: Positive for arthralgias (right knee).  Skin: Negative.   Neurological: Negative for weakness and numbness.       Objective:   Physical Exam  Vitals reviewed. Constitutional: He is oriented to person, place, and time. He appears well-developed and well-nourished. No distress.  HENT:  Head: Normocephalic and atraumatic.  Eyes: Conjunctivae are normal. No scleral icterus.  Cardiovascular: Intact distal pulses.   Pulmonary/Chest: Effort normal.  Musculoskeletal:       Right hip: Normal.       Right knee: He exhibits normal range of motion, no swelling, no effusion, no ecchymosis, no deformity, no erythema, no LCL laxity, no bony tenderness, normal meniscus and no MCL laxity. No tenderness found.       Left knee: Normal.       Right ankle: Normal.       Legs: Neurological: He is alert and oriented to person, place, and time. He has normal reflexes.  Skin: Skin is warm and dry.  Psychiatric: He has a normal mood and affect. His behavior is normal.       Assessment &  Plan:  Knee pain, right anterior - Plan: meloxicam (MOBIC) 15 MG tablet   Mr. Kuzel is a very pleasant 53 yr old male here with 1 month of anterior knee pain.  Exam is normal today.  He has no tenderness, and he has full ROM.  There has been no trauma.  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  Will treat with meloxicam, ice, exercises - printed edu materials for him with instructions on specific exercises.  Will try this for 2-4 wks.  If worsening or no improvement, would favor physical therapy for more targeted stretching/strengthening, but could also consider ortho referral.    Pt to call or RTC if worsening or not improving  E. Natividad Brood MHS, PA-C [facility] 3/2/20154:08 PM

## 2013-10-09 ENCOUNTER — Ambulatory Visit: Payer: BC Managed Care – PPO

## 2013-10-09 ENCOUNTER — Ambulatory Visit (INDEPENDENT_AMBULATORY_CARE_PROVIDER_SITE_OTHER): Payer: BC Managed Care – PPO | Admitting: Emergency Medicine

## 2013-10-09 VITALS — BP 116/72 | HR 75 | Temp 98.4°F | Resp 16 | Ht 68.5 in | Wt 164.2 lb

## 2013-10-09 DIAGNOSIS — M25561 Pain in right knee: Secondary | ICD-10-CM

## 2013-10-09 DIAGNOSIS — M25569 Pain in unspecified knee: Secondary | ICD-10-CM

## 2013-10-09 MED ORDER — PREDNISONE 10 MG PO TABS
ORAL_TABLET | ORAL | Status: DC
Start: 2013-10-09 — End: 2013-10-29

## 2013-10-09 NOTE — Progress Notes (Deleted)
   Subjective:    Patient ID: Jay Osborn, male    DOB: May 26, 1961, 53 y.o.   MRN: [id number]  HPI    Review of Systems     Objective:   Physical Exam        Assessment & Plan:

## 2013-10-09 NOTE — Patient Instructions (Signed)
Please stop your Mobic. I have placed you on a tapered dose of prednisone. I have placed a referral for you to see the orthopedist

## 2013-10-09 NOTE — Progress Notes (Addendum)
Subjective:    Patient ID: Jay Osborn, male    DOB: Oct 07, 1960, 53 y.o.   MRN: [id number]  HPI This chart was scribed for Remo Lipps Raileigh Sabater-MD by Celesta Gentile, Scribe. This patient was seen in room 10 and the patient's care was started at 10:09 AM.  HPI Comments: Jay Osborn is a 53 y.o. male who presents to the [facility] complaining of new lumps on his right leg with associated constant right knee pain that started about 4 weeks ago.  Pt states his knee pain is worse when he sits for long periods of time and sometimes his knee gives out while ambulating.  Pt denies swelling to his right knee.  Pt denies known injury to the knee.  Pt denies having to go up and down stairs in his occupation. Pt states he came in about 3 weeks ago, but they didn't complete an x-ray.    Past Surgical History  Procedure Laterality Date  . Neck surgery      Family History  Problem Relation Age of Onset  . Cancer Father     History   Social History  . Marital Status: Married    Spouse Name: N/A    Number of Children: N/A  . Years of Education: N/A   Occupational History  . Not on file.   Social History Main Topics  . Smoking status: Former Research scientist (life sciences)  . Smokeless tobacco: Not on file  . Alcohol Use: No  . Drug Use: No  . Sexual Activity: Yes   Other Topics Concern  . Not on file   Social History Narrative  . No narrative on file    No Known Allergies  There are no active problems to display for this patient.  Review of Systems  Constitutional: Negative for fever and chills.  Musculoskeletal: Positive for arthralgias (Right knee). Negative for back pain, joint swelling and neck pain.  Skin: Negative for color change and rash.      Objective:   Physical Exam Nursing note and vitals reviewed. Constitutional: He is oriented to person, place, and time. He appears well-developed and well-nourished. No distress.  HENT:  Head: Normocephalic and atraumatic.    Eyes: EOM are normal.  Cardiovascular: Normal rate.   Pulmonary/Chest: Effort normal. No respiratory distress.  Musculoskeletal: Normal range of motion.  Examination of right knee: right tenderness over lateral right knee.  Negative Drawer sign.  McMurray testing and ACL testing is normal.  There is no fluid on the joint.    Neurological: He is alert and oriented to person, place, and time.  Skin: Skin is warm and dry. No rash noted.  Psychiatric: He has a normal mood and affect. His behavior is normal. Judgment and thought content normal.   Filed Vitals:   10/09/13 0905  BP: 116/72  Pulse: 75  Temp: 98.4 F (36.9 C)  TempSrc: Oral  Resp: 16  Height: 5' 8.5" (1.74 m)  Weight: 164 lb 3.2 oz (74.481 kg)  SpO2: 99%   DIAGNOSTIC STUDIES: Oxygen Saturation is 99% on RA, normal by my interpretation.    COORDINATION OF CARE: Will order x-ray of right knee.  [facility] reading (PRIMARY) by  Dr. Everlene Farrier knee films are normal.      Assessment & Plan:  We'll try a short taper of prednisone. Referral made to orthopedics for evaluation. He may be having a tracking problem. His knee exam is essentially normal    I personally performed the services described in this  documentation, which was scribed in my presence. The recorded information has been reviewed and is accurate.

## 2013-10-22 ENCOUNTER — Ambulatory Visit (INDEPENDENT_AMBULATORY_CARE_PROVIDER_SITE_OTHER): Payer: BC Managed Care – PPO | Admitting: Emergency Medicine

## 2013-10-22 VITALS — BP 102/72 | HR 69 | Temp 98.0°F | Resp 16 | Ht 68.5 in | Wt 164.0 lb

## 2013-10-22 DIAGNOSIS — J018 Other acute sinusitis: Secondary | ICD-10-CM

## 2013-10-22 DIAGNOSIS — J209 Acute bronchitis, unspecified: Secondary | ICD-10-CM

## 2013-10-22 MED ORDER — PSEUDOEPHEDRINE-GUAIFENESIN ER 60-600 MG PO TB12: 1.0000 | ORAL_TABLET | Freq: Two times a day (BID) | ORAL | Status: DC

## 2013-10-22 MED ORDER — PROMETHAZINE-CODEINE 6.25-10 MG/5ML PO SYRP: 5.0000 mL | ORAL_SOLUTION | Freq: Four times a day (QID) | ORAL | Status: DC | PRN

## 2013-10-22 MED ORDER — AMOXICILLIN-POT CLAVULANATE 875-125 MG PO TABS: 1.0000 | ORAL_TABLET | Freq: Two times a day (BID) | ORAL | Status: DC

## 2013-10-22 NOTE — Progress Notes (Signed)
[facility] [address] [place] [address] [phone]  Date:  10/22/2013   Name:  Jay Osborn   DOB:  Oct 05, 1960   MRN:  [id number]  PCP:  No primary provider on file.    Chief Complaint: Cough   History of Present Illness:  Jay Osborn is a 53 y.o. very pleasant male patient who presents with the following:  Ill for a week with nasal congestion and drainage of purulent nature.  Has sore throat and post nasal drainage.  Now has a cough productive of purulent sputum.  No wheezing or shortness of breath. Had a fever no chills. No nausea or vomiting.  No stool change.  No improvement with over the counter medications or other home remedies. Denies other complaint or health concern today.   There are no active problems to display for this patient.   History reviewed. No pertinent past medical history.  Past Surgical History  Procedure Laterality Date  . Neck surgery      History  Substance Use Topics  . Smoking status: Former Research scientist (life sciences)  . Smokeless tobacco: Not on file  . Alcohol Use: No    Family History  Problem Relation Age of Onset  . Cancer Father     No Known Allergies  Medication list has been reviewed and updated.  Current Outpatient Prescriptions on File Prior to Visit  Medication Sig Dispense Refill  . predniSONE (DELTASONE) 10 MG tablet Take 4 a day for 3 days 3 a day for 3 days 2 a day for 3 days one a day for 3 days  30 tablet  0   No current facility-administered medications on file prior to visit.    Review of Systems:  As per HPI, otherwise negative.    Physical Examination: Filed Vitals:   10/22/13 1447  BP: 102/72  Pulse: 69  Temp: 98 F (36.7 C)  Resp: 16   Filed Vitals:   10/22/13 1447  Height: 5' 8.5" (1.74 m)  Weight: 164 lb (74.39 kg)   Body mass index is 24.57 kg/(m^2). Ideal Body Weight: Weight in (lb) to have BMI = 25: 166.5  GEN: WDWN, NAD, Non-toxic, A & O x 3 HEENT: Atraumatic,  Normocephalic. Neck supple. No masses, No LAD. Ears and Nose: No external deformity. CV: RRR, No M/G/R. No JVD. No thrill. No extra heart sounds. PULM: CTA B, no wheezes, crackles, rhonchi. No retractions. No resp. distress. No accessory muscle use. ABD: S, NT, ND, +BS. No rebound. No HSM. EXTR: No c/c/e NEURO Normal gait.  PSYCH: Normally interactive. Conversant. Not depressed or anxious appearing.  Calm demeanor.    Assessment and Plan: Sinusitis Bronchitis augmentin mucinex d Phen c cod  Signed,  Ellison Carwin, MD

## 2013-10-22 NOTE — Patient Instructions (Signed)

## 2013-10-25 ENCOUNTER — Ambulatory Visit: Payer: BC Managed Care – PPO

## 2013-10-25 ENCOUNTER — Ambulatory Visit (INDEPENDENT_AMBULATORY_CARE_PROVIDER_SITE_OTHER): Payer: BC Managed Care – PPO | Admitting: Internal Medicine

## 2013-10-25 VITALS — BP 120/70 | HR 90 | Temp 98.3°F | Resp 17 | Ht 69.0 in | Wt 161.0 lb

## 2013-10-25 DIAGNOSIS — Z23 Encounter for immunization: Secondary | ICD-10-CM

## 2013-10-25 DIAGNOSIS — T07XXXA Unspecified multiple injuries, initial encounter: Secondary | ICD-10-CM

## 2013-10-25 DIAGNOSIS — T148XXA Other injury of unspecified body region, initial encounter: Secondary | ICD-10-CM

## 2013-10-25 DIAGNOSIS — M79643 Pain in unspecified hand: Secondary | ICD-10-CM

## 2013-10-25 DIAGNOSIS — M79609 Pain in unspecified limb: Secondary | ICD-10-CM

## 2013-10-25 MED ORDER — HYDROCODONE-ACETAMINOPHEN 5-325 MG PO TABS: 1.0000 | ORAL_TABLET | Freq: Four times a day (QID) | ORAL | Status: DC | PRN

## 2013-10-25 MED ORDER — MUPIROCIN 2 % EX OINT: 1.0000 "application " | TOPICAL_OINTMENT | Freq: Three times a day (TID) | CUTANEOUS | Status: DC

## 2013-10-25 NOTE — Progress Notes (Signed)
   Subjective:    Patient ID: Jay Osborn, male    DOB: 26-Oct-1960, 53 y.o.   MRN: [id number]  HPI 53 year old man here with multiple abrasions due to fall. [place] chasing son on asphault, has many deep abrasion to feet, hands, knees, great toe, shoulder. Most pain swollen area is dorsal right hand. Dirt and grit in many of the abrasions. Can move all injured joints fully, functionally.   Review of Systems     Objective:   Physical Exam  Constitutional: He is oriented to person, place, and time. He appears well-developed and well-nourished. He appears distressed.  HENT:  Head: Normocephalic.  Eyes: Conjunctivae and EOM are normal. Pupils are equal, round, and reactive to light.  Neck: Normal range of motion. Neck supple.  Cardiovascular: Normal rate.   Pulmonary/Chest: Effort normal and breath sounds normal.  Abdominal: There is no tenderness.  Musculoskeletal: Normal range of motion. He exhibits edema and tenderness.       Right shoulder: He exhibits tenderness, laceration and pain. He exhibits normal range of motion, no bony tenderness, no swelling, no effusion and normal strength.       Right knee: He exhibits swelling, ecchymosis, laceration and erythema. He exhibits normal range of motion, no effusion, no deformity, normal alignment, no LCL laxity and no bony tenderness. Tenderness found. No medial joint line, no lateral joint line, no MCL, no LCL and no patellar tendon tenderness noted.       Arms:      Hands:      Legs:      Right foot: Normal.       Left foot: He exhibits swelling and laceration. He exhibits normal range of motion, no bony tenderness, no crepitus and no deformity.       Feet:  Neurological: He is alert and oriented to person, place, and time. No cranial nerve deficit. He exhibits normal muscle tone. Coordination normal.  Psychiatric: He has a normal mood and affect. His behavior is normal. Judgment normal.   Right hand dorsal at 3rd mcpj swollen painful,  abrasions.  Hibiclens scrub to all wounds to clean  PAc Marte to apply wound coverings many wounds [facility] reading (PRIMARY) by  Dr Elder Cyphers no fx seen       Assessment & Plan:  Multiple abrasions and contusions No fxs seen Wound care/Mupirocin ointment Vicodin for pain OOW 3d

## 2013-10-25 NOTE — Patient Instructions (Signed)
Wound Care Wound care helps prevent pain and infection.  You may need a tetanus shot if:  You cannot remember when you had your last tetanus shot.  You have never had a tetanus shot.  The injury broke your skin. If you need a tetanus shot and you choose not to have one, you may get tetanus. Sickness from tetanus can be serious. HOME CARE   Only take medicine as told by your doctor.  Clean the wound daily with mild soap and water.  Change any bandages (dressings) as told by your doctor.  Put medicated cream and a bandage on the wound as told by your doctor.  Change the bandage if it gets wet, dirty, or starts to smell.  Take showers. Do not take baths, swim, or do anything that puts your wound under water.  Rest and raise (elevate) the wound until the pain and puffiness (swelling) are better.  Keep all doctor visits as told. GET HELP RIGHT AWAY IF:   Yellowish-white fluid (pus) comes from the wound.  Medicine does not lessen your pain.  There is a red streak going away from the wound.  You have a fever. MAKE SURE YOU:   Understand these instructions.  Will watch your condition.  Will get help right away if you are not doing well or get worse. Document Released: 04/16/2008 Document Revised: 09/30/2011 Document Reviewed: 11/11/2010 [employer] Patient Information 2014 [employer].  

## 2013-10-29 ENCOUNTER — Ambulatory Visit (INDEPENDENT_AMBULATORY_CARE_PROVIDER_SITE_OTHER): Payer: BC Managed Care – PPO | Admitting: Physician Assistant

## 2013-10-29 VITALS — BP 110/70 | HR 74 | Temp 98.1°F | Resp 16 | Ht 68.0 in | Wt 162.0 lb

## 2013-10-29 DIAGNOSIS — T07XXXA Unspecified multiple injuries, initial encounter: Secondary | ICD-10-CM

## 2013-10-29 DIAGNOSIS — M79609 Pain in unspecified limb: Secondary | ICD-10-CM

## 2013-10-29 DIAGNOSIS — T148XXA Other injury of unspecified body region, initial encounter: Secondary | ICD-10-CM

## 2013-10-29 DIAGNOSIS — Z23 Encounter for immunization: Secondary | ICD-10-CM

## 2013-10-29 NOTE — Progress Notes (Signed)
Subjective:    Patient ID: Jay Osborn, male    DOB: [birth year], 53 y.o.   MRN: [id number]  HPI Primary Physician: No primary provider on file.  Chief Complaint: Recheck wound  HPI: 53 y.o. male with history below presents for wound check. Patient initially presented to clinic on 10/25/13 with multiple skin abrasions along the palmer surface of the hands, dorsal surface of the right hand, right tibia, right dorsal foot, left toe, and left plantar foot. The wounds were washed and dressed with Xeroform dressing. He is here for recheck.   Today he states he is doing well. Afebrile. No chills. No swelling or erythema. Still with some pain along the wound of the left great toe. Xeroform in place along all wounds except right tibia. Full range of motion. Has been washing wounds daily.     No past medical history on file.   Home Meds: Prior to Admission medications   Medication Sig Start Date End Date Taking? Authorizing Provider  HYDROcodone-acetaminophen (NORCO/VICODIN) 5-325 MG per tablet Take 1 tablet by mouth every 6 (six) hours as needed. 10/25/13  Yes Orma Flaming, MD  mupirocin ointment (BACTROBAN) 2 % Apply 1 application topically 3 (three) times daily. 10/25/13  Yes Orma Flaming, MD    Allergies: No Known Allergies  History   Social History  . Marital Status: Married    Spouse Name: N/A    Number of Children: N/A  . Years of Education: N/A   Occupational History  . Not on file.   Social History Main Topics  . Smoking status: Former Research scientist (life sciences)  . Smokeless tobacco: Not on file  . Alcohol Use: No  . Drug Use: No  . Sexual Activity: Yes   Other Topics Concern  . Not on file   Social History Narrative  . No narrative on file     Review of Systems  Constitutional: Negative for fever and chills.  Skin: Positive for wound. Negative for color change, pallor and rash.       Objective:   Physical Exam  Physical Exam: Blood pressure 110/70, pulse 74, temperature  98.1 F (36.7 C), resp. rate 16, height 5\' 8"  (1.727 m), weight 162 lb (73.483 kg), SpO2 98.00%., Body mass index is 24.64 kg/(m^2). General: Well developed, well nourished, in no acute distress. Head: Normocephalic, atraumatic, eyes without discharge, sclera non-icteric, nares are without discharge.    Neck: Supple. Full ROM.  Lungs: Breathing is unlabored. Heart: Regular rate. Msk:  Strength and tone normal for age. Extremities/Skin: Warm and dry. No clubbing or cyanosis. No edema. No rashes. 2 skin abrasions along the dorsal surface of the right hand. Xeroform in place. Mild abrasions along the proximal palmer surface of the bilateral hands. Nickel sized abrasion along the lateral proximal tibia. No Xeroform in place. Superficial abrasion along the right ankle. Abrasion along the distal aspect of the left Great toe. Xeroform dressing in place. Abrasion along the plantar surface of the left foot. Xeroform dressing in place. No erythema, STS, TTP, or drainage along any of the wounds.  Neuro: Alert and oriented X 3. Moves all extremities spontaneously. Gait is normal. CNII-XII grossly in tact. Psych:  Responds to questions appropriately with a normal affect.        Assessment & Plan:  53 year old male here for wound check with multiple skin abrasions  -Xeroform applied to right tibia wound -Wounds washed -Wounds dressed -Wound care   Christell Faith, MHS, PA-C [facility] [place], [place] [address] [facility] 10/29/2013 9:15 AM

## 2014-01-10 ENCOUNTER — Ambulatory Visit (INDEPENDENT_AMBULATORY_CARE_PROVIDER_SITE_OTHER): Payer: BC Managed Care – PPO

## 2014-01-10 ENCOUNTER — Ambulatory Visit (INDEPENDENT_AMBULATORY_CARE_PROVIDER_SITE_OTHER): Payer: BC Managed Care – PPO | Admitting: Emergency Medicine

## 2014-01-10 VITALS — BP 100/70 | HR 72 | Temp 98.1°F | Resp 16 | Ht 68.5 in | Wt 159.4 lb

## 2014-01-10 DIAGNOSIS — M25511 Pain in right shoulder: Secondary | ICD-10-CM

## 2014-01-10 DIAGNOSIS — M7551 Bursitis of right shoulder: Secondary | ICD-10-CM

## 2014-01-10 DIAGNOSIS — M719 Bursopathy, unspecified: Secondary | ICD-10-CM

## 2014-01-10 DIAGNOSIS — M25519 Pain in unspecified shoulder: Secondary | ICD-10-CM

## 2014-01-10 DIAGNOSIS — M67919 Unspecified disorder of synovium and tendon, unspecified shoulder: Secondary | ICD-10-CM

## 2014-01-10 MED ORDER — MELOXICAM 7.5 MG PO TABS: 7.5000 mg | ORAL_TABLET | Freq: Every day | ORAL | Status: DC

## 2014-01-10 NOTE — Patient Instructions (Signed)
Bursitis °Bursitis is a swelling and soreness (inflammation) of a fluid-filled sac (bursa) that overlies and protects a joint. It can be caused by injury, overuse of the joint, arthritis or infection. The joints most likely to be affected are the elbows, shoulders, hips and knees. °HOME CARE INSTRUCTIONS  °· Apply ice to the affected area for 15-20 minutes each hour while awake for 2 days. Put the ice in a plastic bag and place a towel between the bag of ice and your skin. °· Rest the injured joint as much as possible, but continue to put the joint through a full range of motion, 4 times per day. (The shoulder joint especially becomes rapidly "frozen" if not used.) When the pain lessens, begin normal slow movements and usual activities. °· Only take over-the-counter or prescription medicines for pain, discomfort or fever as directed by your caregiver. °· Your caregiver may recommend draining the bursa and injecting medicine into the bursa. This may help the healing process. °· Follow all instructions for follow-up with your caregiver. This includes any orthopedic referrals, physical therapy and rehabilitation. Any delay in obtaining necessary care could result in a delay or failure of the bursitis to heal and chronic pain. °SEEK IMMEDIATE MEDICAL CARE IF:  °· Your pain increases even during treatment. °· You develop an oral temperature above 102° F (38.9° C) and have heat and inflammation over the involved bursa. °MAKE SURE YOU:  °· Understand these instructions. °· Will watch your condition. °· Will get help right away if you are not doing well or get worse. °Document Released: 07/05/2000 Document Revised: 09/30/2011 Document Reviewed: 06/09/2009 °ExitCare® Patient Information ©2015 [employer]. This information is not intended to replace advice given to you by your health care provider. Make sure you discuss any questions you have with your health care provider. ° °

## 2014-01-10 NOTE — Progress Notes (Signed)
   Subjective:    Patient ID: Jay Osborn, male    DOB: 10-29-1960, 53 y.o.   MRN: [id number]  HPI 53 year old male pt presents with right shoulder pain for 2 weeks. [place] 2 months ago, but was seen here for that. Works at [employer]. Does not have to do a lot of heavy lifting at work. He feels pain when he abducts his arm or when picking up an object.   Review of Systems     Objective:   Physical Exam patient is tenderness over the subdeltoid area. There is no tenderness over the a.c. joint. 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    [facility] reading (PRIMARY) by  Dr. Everlene Farrier  is a soft tissue density present but no definite masses the joint itself looks normal there is a apparent bone island present on one view that is approximately 1/2 cm      Assessment & Plan:  Patient started on shoulder exercises to start on Mobic 7.5 one to 2 a day. X-rays to the radiologist comments on soft tissue density. If he has persistent problems we'll inject with steroids

## 2014-02-20 ENCOUNTER — Ambulatory Visit (INDEPENDENT_AMBULATORY_CARE_PROVIDER_SITE_OTHER): Payer: BC Managed Care – PPO | Admitting: Emergency Medicine

## 2014-02-20 VITALS — BP 108/66 | HR 68 | Temp 98.0°F | Resp 20 | Ht 68.5 in | Wt 157.4 lb

## 2014-02-20 DIAGNOSIS — M719 Bursopathy, unspecified: Secondary | ICD-10-CM

## 2014-02-20 DIAGNOSIS — M67919 Unspecified disorder of synovium and tendon, unspecified shoulder: Secondary | ICD-10-CM

## 2014-02-20 DIAGNOSIS — M7551 Bursitis of right shoulder: Secondary | ICD-10-CM

## 2014-02-20 MED ORDER — METHYLPREDNISOLONE ACETATE 80 MG/ML IJ SUSP
80.0000 mg | Freq: Once | INTRAMUSCULAR | Status: AC
  Administered 2014-02-20: 80 mg via INTRAMUSCULAR

## 2014-02-20 MED ORDER — NAPROXEN SODIUM 550 MG PO TABS: 550.0000 mg | ORAL_TABLET | Freq: Two times a day (BID) | ORAL | Status: DC

## 2014-02-20 NOTE — Progress Notes (Signed)
[facility] [address], [place] [address] [phone]  Date:  02/20/2014   Name:  Jay Osborn   DOB:  March 30, 1961   MRN:  [id number]  PCP:  No PCP Per Patient    Chief Complaint: Shoulder Pain   History of Present Illness:  Jay Osborn is a 53 y.o. very pleasant male patient who presents with the following:  Continues to have pain in right shoulder following visit in June.  No improvement with mobic.  Now has pain in opposite shoulder but still has full AROM.   No history of antecedent injury or overuse.  No improvement with over the counter medications or other home remedies. Denies other complaint or health concern today.   There are no active problems to display for this patient.   No past medical history on file.  Past Surgical History  Procedure Laterality Date  . Neck surgery      History  Substance Use Topics  . Smoking status: Former Research scientist (life sciences)  . Smokeless tobacco: Not on file  . Alcohol Use: No    Family History  Problem Relation Age of Onset  . Cancer Father     No Known Allergies  Medication list has been reviewed and updated.  Current Outpatient Prescriptions on File Prior to Visit  Medication Sig Dispense Refill  . meloxicam (MOBIC) 7.5 MG tablet Take 1 tablet (7.5 mg total) by mouth daily.  20 tablet  0   No current facility-administered medications on file prior to visit.    Review of Systems:  As per HPI, otherwise negative.    Physical Examination: Filed Vitals:   02/20/14 1107  BP: 108/66  Pulse: 68  Temp: 98 F (36.7 C)  Resp: 20   Filed Vitals:   02/20/14 1107  Height: 5' 8.5" (1.74 m)  Weight: 157 lb 6.4 oz (71.396 kg)   Body mass index is 23.58 kg/(m^2). Ideal Body Weight: Weight in (lb) to have BMI = 25: 166.5   GEN: WDWN, NAD, Non-toxic, Alert & Oriented x 3 HEENT: Atraumatic, Normocephalic.  Ears and Nose: No external deformity. EXTR: No clubbing/cyanosis/edema NEURO: Normal gait.   PSYCH: Normally interactive. Conversant. Not depressed or anxious appearing.  Calm demeanor.  Left shoulder:  Full AROM RIGHT shoulder unable to Adduct upper arm. Point tender posterior shoulder joint line  Assessment and Plan: Shoulder bursitis. Depo medrol 40 mg Anaprox  Signed,  Ellison Carwin, MD

## 2014-02-20 NOTE — Patient Instructions (Signed)
Bursitis °Bursitis is a swelling and soreness (inflammation) of a fluid-filled sac (bursa) that overlies and protects a joint. It can be caused by injury, overuse of the joint, arthritis or infection. The joints most likely to be affected are the elbows, shoulders, hips and knees. °HOME CARE INSTRUCTIONS  °· Apply ice to the affected area for 15-20 minutes each hour while awake for 2 days. Put the ice in a plastic bag and place a towel between the bag of ice and your skin. °· Rest the injured joint as much as possible, but continue to put the joint through a full range of motion, 4 times per day. (The shoulder joint especially becomes rapidly "frozen" if not used.) When the pain lessens, begin normal slow movements and usual activities. °· Only take over-the-counter or prescription medicines for pain, discomfort or fever as directed by your caregiver. °· Your caregiver may recommend draining the bursa and injecting medicine into the bursa. This may help the healing process. °· Follow all instructions for follow-up with your caregiver. This includes any orthopedic referrals, physical therapy and rehabilitation. Any delay in obtaining necessary care could result in a delay or failure of the bursitis to heal and chronic pain. °SEEK IMMEDIATE MEDICAL CARE IF:  °· Your pain increases even during treatment. °· You develop an oral temperature above 102° F (38.9° C) and have heat and inflammation over the involved bursa. °MAKE SURE YOU:  °· Understand these instructions. °· Will watch your condition. °· Will get help right away if you are not doing well or get worse. °Document Released: 07/05/2000 Document Revised: 09/30/2011 Document Reviewed: 09/27/2013 °ExitCare® Patient Information ©2015 [employer]. This information is not intended to replace advice given to you by your health care provider. Make sure you discuss any questions you have with your health care provider. ° °

## 2014-04-13 ENCOUNTER — Ambulatory Visit (INDEPENDENT_AMBULATORY_CARE_PROVIDER_SITE_OTHER): Payer: BC Managed Care – PPO | Admitting: Family Medicine

## 2014-04-13 VITALS — BP 110/68 | HR 73 | Temp 97.9°F | Resp 17 | Ht 68.5 in | Wt 157.4 lb

## 2014-04-13 DIAGNOSIS — M1990 Unspecified osteoarthritis, unspecified site: Secondary | ICD-10-CM

## 2014-04-13 DIAGNOSIS — J302 Other seasonal allergic rhinitis: Secondary | ICD-10-CM

## 2014-04-13 DIAGNOSIS — Z0000 Encounter for general adult medical examination without abnormal findings: Secondary | ICD-10-CM

## 2014-04-13 DIAGNOSIS — N400 Enlarged prostate without lower urinary tract symptoms: Secondary | ICD-10-CM

## 2014-04-13 DIAGNOSIS — J309 Allergic rhinitis, unspecified: Secondary | ICD-10-CM

## 2014-04-13 LAB — POCT CBC
GRANULOCYTE PERCENT: 64.9 % (ref 37–80)
HEMATOCRIT: 43.9 % (ref 43.5–53.7)
Hemoglobin: 14.3 g/dL (ref 14.1–18.1)
Lymph, poc: 1.6 (ref 0.6–3.4)
MCH, POC: 28.6 pg (ref 27–31.2)
MCHC: 32.7 g/dL (ref 31.8–35.4)
MCV: 87.6 fL (ref 80–97)
MID (cbc): 0.5 (ref 0–0.9)
MPV: 8.9 fL (ref 0–99.8)
POC Granulocyte: 3.8 (ref 2–6.9)
POC LYMPH %: 27.1 % (ref 10–50)
POC MID %: 8 %M (ref 0–12)
Platelet Count, POC: 188 10*3/uL (ref 142–424)
RBC: 5.01 M/uL (ref 4.69–6.13)
RDW, POC: 12.6 %
WBC: 5.8 10*3/uL (ref 4.6–10.2)

## 2014-04-13 LAB — LIPID PANEL
CHOLESTEROL: 164 mg/dL (ref 0–200)
HDL: 51 mg/dL (ref >39)
LDL Cholesterol: 100 mg/dL — ABNORMAL HIGH (ref 0–99)
TRIGLYCERIDES: 65 mg/dL (ref <150)
Total CHOL/HDL Ratio: 3.2 Ratio
VLDL: 13 mg/dL (ref 0–40)

## 2014-04-13 LAB — COMPREHENSIVE METABOLIC PANEL
ALT: 16 U/L (ref 0–53)
AST: 19 U/L (ref 0–37)
Albumin: 4.6 g/dL (ref 3.5–5.2)
Alkaline Phosphatase: 62 U/L (ref 39–117)
BUN: 11 mg/dL (ref 6–23)
CALCIUM: 9.3 mg/dL (ref 8.4–10.5)
CHLORIDE: 103 meq/L (ref 96–112)
CO2: 28 mEq/L (ref 19–32)
Creat: 0.86 mg/dL (ref 0.50–1.35)
Glucose, Bld: 81 mg/dL (ref 70–99)
Potassium: 4.4 mEq/L (ref 3.5–5.3)
SODIUM: 140 meq/L (ref 135–145)
TOTAL PROTEIN: 7.4 g/dL (ref 6.0–8.3)
Total Bilirubin: 0.4 mg/dL (ref 0.2–1.2)

## 2014-04-13 LAB — POCT URINALYSIS DIPSTICK
BILIRUBIN UA: NEGATIVE
Blood, UA: NEGATIVE
GLUCOSE UA: NEGATIVE
KETONES UA: NEGATIVE
Nitrite, UA: NEGATIVE
Protein, UA: NEGATIVE
Spec Grav, UA: 1.015
Urobilinogen, UA: 0.2
pH, UA: 6

## 2014-04-13 LAB — IFOBT (OCCULT BLOOD): IFOBT: NEGATIVE

## 2014-04-13 NOTE — Progress Notes (Signed)
Annual physical examination:  History: Patient is here for physical exam which he wants to get yearly.  Past medical history: Medical: History of arthritis Surgical: Cervical disc surgery Medications: None Allergies: None  Family history: Parents are deceased. Father had cancer, mother elderly of natural causes   Social history: Does not smoke drink or use tobacco. He is married. Has been a political exile living and [place] for about 17 years. Cannot go back to [place]. Married with 4 children Works as Sales executive at [employer].  Review of systems: Constitutional: Unremarkable HEENT: Unremarkable Respiratory: Unremarkable Cardiovascular: Unremarkable Gastrointestinal: Unremarkable Endocrine: Unremarkable Genitourinary: Unremarkable Musculoskeletal: Joint pains Allergies: Seasonal allergies Neurologic: Unremarkable Hematologic: Unremarkable Psychiatric: Unremarkable  Physical examination: Well-developed well-nourished man in no acute distress. TMs normal. Eyes PERRLA. Fundi benign. Throat clear. Teeth fair. Neck supple without nodes. Chest clear. Heart regular without murmurs. Abdomen soft without mass or tenderness. Normal male external genitalia testes descended. No hernias. No axillary or inguinal nodes. Digital rectal exam his prostate gland to be on the large side. Extremities normal. Gait normal.  Assessment: Normal physical examination History cervical disc disease Seasonal allergies  Plan: Screening labs Return annually        Results for orders placed in visit on 04/13/14  POCT CBC      Result Value Ref Range   WBC 5.8  4.6 - 10.2 K/uL   Lymph, poc 1.6  0.6 - 3.4   POC LYMPH PERCENT 27.1  10 - 50 %L   MID (cbc) 0.5  0 - 0.9   POC MID % 8.0  0 - 12 %M   POC Granulocyte 3.8  2 - 6.9   Granulocyte percent 64.9  37 - 80 %G   RBC 5.01  4.69 - 6.13 M/uL   Hemoglobin 14.3  14.1 - 18.1 g/dL   HCT, POC 43.9  43.5 - 53.7 %   MCV 87.6  80 - 97 fL   MCH, POC  28.6  27 - 31.2 pg   MCHC 32.7  31.8 - 35.4 g/dL   RDW, POC 12.6     Platelet Count, POC 188  142 - 424 K/uL   MPV 8.9  0 - 99.8 fL  IFOBT (OCCULT BLOOD)      Result Value Ref Range   IFOBT Negative    POCT URINALYSIS DIPSTICK      Result Value Ref Range   Color, UA yellow     Clarity, UA clear     Glucose, UA neg     Bilirubin, UA neg     Ketones, UA neg     Spec Grav, UA 1.015     Blood, UA neg     pH, UA 6.0     Protein, UA neg     Urobilinogen, UA 0.2     Nitrite, UA neg     Leukocytes, UA small (1+)

## 2014-04-13 NOTE — Patient Instructions (Signed)
Return annually for physical examination  I will let you know the results of your additional labs  The exam was normal today.

## 2014-04-14 LAB — PSA: PSA: 1.89 ng/mL (ref <=4.00)

## 2014-04-15 ENCOUNTER — Encounter: Payer: Self-pay | Admitting: *Deleted

## 2014-04-20 ENCOUNTER — Ambulatory Visit (INDEPENDENT_AMBULATORY_CARE_PROVIDER_SITE_OTHER): Payer: BC Managed Care – PPO | Admitting: Family Medicine

## 2014-04-20 VITALS — BP 110/72 | HR 69 | Temp 97.8°F | Resp 18 | Ht 68.0 in | Wt 157.0 lb

## 2014-04-20 DIAGNOSIS — J209 Acute bronchitis, unspecified: Secondary | ICD-10-CM

## 2014-04-20 DIAGNOSIS — R05 Cough: Secondary | ICD-10-CM

## 2014-04-20 DIAGNOSIS — J208 Acute bronchitis due to other specified organisms: Secondary | ICD-10-CM

## 2014-04-20 DIAGNOSIS — R059 Cough, unspecified: Secondary | ICD-10-CM

## 2014-04-20 MED ORDER — HYDROCODONE-HOMATROPINE 5-1.5 MG/5ML PO SYRP: 5.0000 mL | ORAL_SOLUTION | ORAL | Status: DC | PRN

## 2014-04-20 MED ORDER — BENZONATATE 100 MG PO CAPS: 100.0000 mg | ORAL_CAPSULE | Freq: Three times a day (TID) | ORAL | Status: DC | PRN

## 2014-04-20 NOTE — Progress Notes (Signed)
Subjective: Patient was here one week ago for his physical. Several days ago he developed a bad cough. It is fairly nonproductive. He is coughing day and night, especially when he lays down at night. He has a hard time sleeping because of the cough. Has not been running a fever. He has some sore his nose.  Objective: TMs normal. A little erythema in his nose. Throat clear. Neck supple without nodes. Chest clear to auscultation. He has a hacking cough. Heart regular without murmurs.  Assessment: Cough Viral bronchitis  Plan: Treat with cough syrup and cough pills. Drink plenty of fluids and get enough rest. Use some Polysporin in the nose and take Claritin-D if necessary. Return as needed.

## 2014-04-20 NOTE — Patient Instructions (Signed)
Plenty of fluids  Get enough rest  Avoid smoky density areas  Take the cough pills 1 or 2 pills 3 times daily as needed for cough  Take the cough syrup after work or at nighttime 1 teaspoon every 4-6 hours as necessary. It will make you drowsy usually.  If you start bringing up a lot of yellow and green mucus or are getting worse at anytime we may need to add some other medication.

## 2015-02-12 ENCOUNTER — Ambulatory Visit (INDEPENDENT_AMBULATORY_CARE_PROVIDER_SITE_OTHER): Payer: BLUE CROSS/BLUE SHIELD | Admitting: Internal Medicine

## 2015-02-12 VITALS — BP 114/68 | HR 75 | Temp 98.8°F | Resp 17 | Ht 68.5 in | Wt 154.0 lb

## 2015-02-12 DIAGNOSIS — L729 Follicular cyst of the skin and subcutaneous tissue, unspecified: Secondary | ICD-10-CM | POA: Diagnosis not present

## 2015-02-12 DIAGNOSIS — L089 Local infection of the skin and subcutaneous tissue, unspecified: Secondary | ICD-10-CM

## 2015-02-12 MED ORDER — DOXYCYCLINE HYCLATE 100 MG PO TABS: 100.0000 mg | ORAL_TABLET | Freq: Two times a day (BID) | ORAL | Status: DC

## 2015-02-12 NOTE — Progress Notes (Signed)
   Subjective:    Patient ID: Jay Osborn, male    DOB: [birth year], 54 y.o.   MRN: [id number] This chart was scribed for Tami Lin, MD by Zola Button, Medical Scribe. This patient was seen in Room 11 and the patient's care was started at 12:57 PM.   HPI HPI Comments: Jay Osborn is a 54 y.o. male who presents to the [facility] complaining of gradual onset cyst to his right axilla that started 1 week ago. He does report pain to the area upon palpation.  There are no active problems to display for this patient.  Review of Systems Noncontributory    Objective:   Physical Exam  Constitutional: He is oriented to person, place, and time. He appears well-developed and well-nourished. No distress.  HENT:  Head: Normocephalic and atraumatic.  Mouth/Throat: No oropharyngeal exudate.  Eyes: Pupils are equal, round, and reactive to light.  Neck: Neck supple.  Cardiovascular: Normal rate.   Pulmonary/Chest: Effort normal.  Musculoskeletal: He exhibits no edema.  Neurological: He is alert and oriented to person, place, and time. No cranial nerve deficit.  Skin: Skin is warm and dry. No rash noted.  Under the right axilla is a 1 cm x 2 cm fluctuant tender cyst without erythema//freely movable  Psychiatric: He has a normal mood and affect. His behavior is normal.  Vitals reviewed.  Procedure: Anesthesia with 2 mL of 2% Xylocaine I&D performed with Pus and sebaceous material released Cultured       Assessment & Plan:  Infected cyst of skin - Plan: Wound culture  Meds ordered this encounter  Medications  . doxycycline (VIBRA-TABS) 100 MG tablet    Sig: Take 1 tablet (100 mg total) by mouth 2 (two) times daily.    Dispense:  20 tablet    Refill:  0   Hot comp F/u 10d if not well  I have completed the patient encounter in its entirety as documented by the scribe, with editing by me where necessary. Robert P. Laney Pastor, M.D.

## 2015-02-14 ENCOUNTER — Telehealth: Payer: Self-pay

## 2015-02-14 LAB — WOUND CULTURE

## 2015-02-14 NOTE — Telephone Encounter (Signed)
Pt states he was seen by Dr Laney Pastor and he isn't much better, would like to speak with someone, please call [phone]

## 2015-02-14 NOTE — Telephone Encounter (Signed)
Spoke to pt. He was seen two days ago. I told him he needs to give the abx roughly two full days to really start working. I told him to continue with hot compresses to help with swelling. Told pt if he feels the wound looks worse or is causing more pain he needs to come back to the office. Pt voiced understanding.

## 2015-02-14 NOTE — Telephone Encounter (Signed)
I agree with Callie. He should continue doxycycline since culture shows susceptibility to doxy.

## 2015-03-19 ENCOUNTER — Ambulatory Visit (INDEPENDENT_AMBULATORY_CARE_PROVIDER_SITE_OTHER): Payer: BLUE CROSS/BLUE SHIELD | Admitting: Physician Assistant

## 2015-03-19 VITALS — BP 118/60 | HR 67 | Temp 98.7°F | Ht 68.75 in | Wt 155.0 lb

## 2015-03-19 DIAGNOSIS — W5581XA Bitten by other mammals, initial encounter: Secondary | ICD-10-CM

## 2015-03-19 DIAGNOSIS — T148 Other injury of unspecified body region: Secondary | ICD-10-CM | POA: Diagnosis not present

## 2015-03-19 DIAGNOSIS — T148XXA Other injury of unspecified body region, initial encounter: Secondary | ICD-10-CM

## 2015-03-19 NOTE — Patient Instructions (Signed)
There are no signs of infection at this time. No deep structures were involved in the bite. Continue to clean with soap and water, you are up to date on your tetanus.  If you notice redness around the area, pus draining our of the area, increasing pain or fevers and chills please come back to see [place] asap for further evaluation.

## 2015-03-19 NOTE — Progress Notes (Signed)
   Subjective:    Patient ID: Jay Osborn, male    DOB: 1960/08/13, 54 y.o.   MRN: [id number]  Chief Complaint  Patient presents with  . Animal Bite    mouse bite Friday evening - broke the skin   Medications, allergies, past medical history, surgical history, family history, social history and problem list reviewed and updated.  HPI  54 yom presents after mouse bite to hand 2 days ago.   Mouse in kitchen. Small bite to left index finger. Utd on tetanus last year. Denies fevers, chills. Presented as worried about bacterial infection.   Review of Systems See HPI     Objective:   Physical Exam  Constitutional: He is oriented to person, place, and time.  BP 118/60 mmHg  Pulse 67  Temp(Src) 98.7 F (37.1 C) (Oral)  Ht 5' 8.75" (1.746 m)  Wt 155 lb (70.308 kg)  BMI 23.06 kg/m2  SpO2 99%   Neurological: He is alert and oriented to person, place, and time.  Skin:  1-52mm superficial bite mark left lateral distal index finger. No surrounding erythema. No induration. No purulence.       Assessment & Plan:   Animal bite --very superficial mouse bite left index finger, no signs infection --tdap utd, continue cleaning with soap and water --rtc with signs infection  Julieta Gutting, PA-C Physician Assistant-Certified [facility]  03/19/2015 2:14 PM

## 2015-05-10 ENCOUNTER — Ambulatory Visit (INDEPENDENT_AMBULATORY_CARE_PROVIDER_SITE_OTHER): Payer: BLUE CROSS/BLUE SHIELD | Admitting: Family Medicine

## 2015-05-10 VITALS — BP 118/74 | HR 66 | Temp 98.0°F | Resp 16 | Ht 68.25 in | Wt 158.0 lb

## 2015-05-10 DIAGNOSIS — R05 Cough: Secondary | ICD-10-CM | POA: Diagnosis not present

## 2015-05-10 DIAGNOSIS — Z23 Encounter for immunization: Secondary | ICD-10-CM | POA: Diagnosis not present

## 2015-05-10 DIAGNOSIS — R059 Cough, unspecified: Secondary | ICD-10-CM

## 2015-05-10 MED ORDER — PREDNISONE 20 MG PO TABS: ORAL_TABLET | ORAL | Status: DC

## 2015-05-10 MED ORDER — HYDROCODONE-HOMATROPINE 5-1.5 MG/5ML PO SYRP: 5.0000 mL | ORAL_SOLUTION | Freq: Every evening | ORAL | Status: DC | PRN

## 2015-05-10 NOTE — Progress Notes (Addendum)
@UMFCLOGO @  This chart was scribed for Jay Haber, MD by Thea Alken, ED Scribe. This patient was seen in room 10 and the patient's care was started at 4:23 PM.  Patient ID: Jay Osborn MRN: [id number], DOB: 02-01-1961, 54 y.o. Date of Encounter: 05/10/2015, 4:23 PM  Primary Physician: No PCP Per Patient  Chief Complaint:  Chief Complaint  Patient presents with   Cough    Usually dry   Chest congestion    Patient states "Chest is itchy"    Immunizations    flu vaccine    HPI: 54 y.o. year old male with history below presents with 2 week hx of cough. Pt states cough was initially productive with white phlegm but cough is now dry with associated scratchy throat. Pt reports cough is worse at night. He has hx of allergies. Pt works at [employer], a non dusty facility.    Past Medical History  Diagnosis Date   Arthritis      Home Meds: Prior to Admission medications   Medication Sig Start Date End Date Taking? Authorizing Provider  MELOXICAM PO Take by mouth.   Yes Historical Provider, MD    Allergies: No Known Allergies  Social History   Social History   Marital Status: Married    Spouse Name: N/A   Number of Children: N/A   Years of Education: N/A   Occupational History   Not on file.   Social History Main Topics   Smoking status: Former Smoker    Quit date: 03/18/2013   Smokeless tobacco: Former Systems developer    Types: Chew    Quit date: 03/18/2013   Alcohol Use: No   Drug Use: No   Sexual Activity: Yes    Museum/gallery curator: None   Other Topics Concern   Not on file   Social History Narrative     Review of Systems: Constitutional: negative for chills, fever, night sweats, weight changes, or fatigue  HEENT: negative for vision changes, hearing loss, congestion, rhinorrhea, ST, epistaxis, or sinus pressure Cardiovascular: negative for chest pain or palpitations Respiratory: negative for hemoptysis, wheezing, shortness of  breath, or cough Abdominal: negative for abdominal pain, nausea, vomiting, diarrhea, or constipation Dermatological: negative for rash Neurologic: negative for headache, dizziness, or syncope All other systems reviewed and are otherwise negative with the exception to those above and in the HPI.   Physical Exam: Blood pressure 118/74, pulse 66, temperature 98 F (36.7 C), temperature source Oral, resp. rate 16, height 5' 8.25" (1.734 m), weight 158 lb (71.668 kg), SpO2 99 %., Body mass index is 23.84 kg/(m^2). General: Well developed, well nourished, in no acute distress. Head: Normocephalic, atraumatic, eyes without discharge, sclera non-icteric, nares are without discharge. Bilateral auditory canals clear, TM's are without perforation, pearly grey and translucent with reflective cone of light bilaterally. Oral cavity moist, posterior pharynx without exudate, erythema, peritonsillar abscess, or post nasal drip.  Neck: Supple. No thyromegaly. Full ROM. No lymphadenopathy. Lungs: Few wheezes. No rales or rhonchi.  Breathing is unlabored. Heart: RRR with S1 S2. No murmurs, rubs, or gallops appreciated. Abdomen: Soft, non-tender, non-distended with normoactive bowel sounds. No hepatomegaly. No rebound/guarding. No obvious abdominal masses. Msk:  Strength and tone normal for age. Extremities/Skin: Warm and dry. No clubbing or cyanosis. No edema. No rashes or suspicious lesions. Neuro: Alert and oriented X 3. Moves all extremities spontaneously. Gait is normal. CNII-XII grossly in tact. Psych:  Responds to questions appropriately with a normal affect.   ASSESSMENT AND PLAN:  54 y.o. year old male with allergic cough This chart was scribed in my presence and reviewed by me personally.    ICD-9-CM ICD-10-CM   1. Cough 786.2 R05 predniSONE (DELTASONE) 20 MG tablet     HYDROcodone-homatropine (HYCODAN) 5-1.5 MG/5ML syrup  2. Need for prophylactic vaccination and inoculation against influenza V04.81  Z23 Flu Vaccine QUAD 36+ mos IM    Signed, Jay Haber, MD 05/10/2015 4:23 PM

## 2015-06-29 ENCOUNTER — Encounter: Payer: Self-pay | Admitting: Urgent Care

## 2015-06-29 ENCOUNTER — Ambulatory Visit (INDEPENDENT_AMBULATORY_CARE_PROVIDER_SITE_OTHER): Payer: BLUE CROSS/BLUE SHIELD | Admitting: Urgent Care

## 2015-06-29 VITALS — BP 100/65 | HR 65 | Temp 98.4°F | Resp 16 | Ht 69.0 in | Wt 155.0 lb

## 2015-06-29 DIAGNOSIS — Z0000 Encounter for general adult medical examination without abnormal findings: Secondary | ICD-10-CM

## 2015-06-29 DIAGNOSIS — Z8042 Family history of malignant neoplasm of prostate: Secondary | ICD-10-CM

## 2015-06-29 LAB — COMPREHENSIVE METABOLIC PANEL
ALBUMIN: 4.1 g/dL (ref 3.6–5.1)
ALK PHOS: 56 U/L (ref 40–115)
ALT: 18 U/L (ref 9–46)
AST: 23 U/L (ref 10–35)
BUN: 11 mg/dL (ref 7–25)
CO2: 32 mmol/L — ABNORMAL HIGH (ref 20–31)
CREATININE: 0.91 mg/dL (ref 0.70–1.33)
Calcium: 9.2 mg/dL (ref 8.6–10.3)
Chloride: 101 mmol/L (ref 98–110)
Glucose, Bld: 88 mg/dL (ref 65–99)
POTASSIUM: 4.1 mmol/L (ref 3.5–5.3)
SODIUM: 139 mmol/L (ref 135–146)
TOTAL PROTEIN: 7 g/dL (ref 6.1–8.1)
Total Bilirubin: 0.5 mg/dL (ref 0.2–1.2)

## 2015-06-29 LAB — CBC
HCT: 42.3 % (ref 39.0–52.0)
Hemoglobin: 14.3 g/dL (ref 13.0–17.0)
MCH: 28.6 pg (ref 26.0–34.0)
MCHC: 33.8 g/dL (ref 30.0–36.0)
MCV: 84.6 fL (ref 78.0–100.0)
MPV: 10.8 fL (ref 8.6–12.4)
Platelets: 222 10*3/uL (ref 150–400)
RBC: 5 MIL/uL (ref 4.22–5.81)
RDW: 12.9 % (ref 11.5–15.5)
WBC: 4.1 10*3/uL (ref 4.0–10.5)

## 2015-06-29 LAB — TSH: TSH: 1.984 u[IU]/mL (ref 0.350–4.500)

## 2015-06-29 LAB — LIPID PANEL
CHOLESTEROL: 176 mg/dL (ref 125–200)
HDL: 56 mg/dL (ref >=40)
LDL Cholesterol: 107 mg/dL (ref <130)
Total CHOL/HDL Ratio: 3.1 Ratio (ref <=5.0)
Triglycerides: 63 mg/dL (ref <150)
VLDL: 13 mg/dL (ref <30)

## 2015-06-29 NOTE — Patient Instructions (Addendum)
Keeping you healthy  Get these tests  Blood pressure- Have your blood pressure checked once a year by your healthcare provider.  Normal blood pressure is 120/80  Weight- Have your body mass index (BMI) calculated to screen for obesity.  BMI is a measure of body fat based on height and weight. You can also calculate your own BMI at ViewBanking.si.  Cholesterol- Have your cholesterol checked every year.  Diabetes- Have your blood sugar checked regularly if you have high blood pressure, high cholesterol, have a family history of diabetes or if you are overweight.  Screening for Colon Cancer- Colonoscopy starting at age 59.  Screening may begin sooner depending on your family history and other health conditions. Follow up colonoscopy as directed by your Gastroenterologist.  Screening for Prostate Cancer- Both blood work (PSA) and a rectal exam help screen for Prostate Cancer.  Screening begins at age 74 with African-American men and at age 99 with Caucasian men.  Screening may begin sooner depending on your family history.  Take these medicines  Aspirin- One aspirin daily can help prevent Heart disease and Stroke.  Flu shot- Every fall.  Tetanus- Every 10 years.  Zostavax- Once after the age of 38 to prevent Shingles.  Pneumonia shot- Once after the age of 54; if you are younger than 86, ask your healthcare provider if you need a Pneumonia shot.  Take these steps  Don't smoke- If you do smoke, talk to your doctor about quitting.  For tips on how to quit, go to www.smokefree.gov or call 1-800-QUIT-NOW.  Be physically active- Exercise 5 days a week for at least 30 minutes.  If you are not already physically active start slow and gradually work up to 30 minutes of moderate physical activity.  Examples of moderate activity include walking briskly, mowing the yard, dancing, swimming, bicycling, etc.  Eat a healthy diet- Eat a variety of healthy food such as fruits, vegetables, low  fat milk, low fat cheese, yogurt, lean meant, poultry, fish, beans, tofu, etc. For more information go to www.thenutritionsource.org  Drink alcohol in moderation- Limit alcohol intake to less than two drinks a day. Never drink and drive.  Dentist- Brush and floss twice daily; visit your dentist twice a year.  Depression- Your emotional health is as important as your physical health. If you're feeling down, or losing interest in things you would normally enjoy please talk to your healthcare provider.  Eye exam- Visit your eye doctor every year.  Safe sex- If you may be exposed to a sexually transmitted infection, use a condom.  Seat belts- Seat belts can save your life; always wear one.  Smoke/Carbon Monoxide detectors- These detectors need to be installed on the appropriate level of your home.  Replace batteries at least once a year.  Skin cancer- When out in the sun, cover up and use sunscreen 15 SPF or higher.  Violence- If anyone is threatening you, please tell your healthcare provider.  Living Will/ Health care power of attorney- Speak with your healthcare provider and family.    Colonoscopy A colonoscopy is an exam to look at the entire large intestine (colon). This exam can help find problems such as tumors, polyps, inflammation, and areas of bleeding. The exam takes about 1 hour.  LET [facility] CARE PROVIDER KNOW ABOUT:   Any allergies you have.  All medicines you are taking, including vitamins, herbs, eye drops, creams, and over-the-counter medicines.  Previous problems you or members of your family have had with  the use of anesthetics.  Any blood disorders you have.  Previous surgeries you have had.  Medical conditions you have. RISKS AND COMPLICATIONS  Generally, this is a safe procedure. However, as with any procedure, complications can occur. Possible complications include:  Bleeding.  Tearing or rupture of the colon wall.  Reaction to medicines given  during the exam.  Infection (rare). BEFORE THE PROCEDURE   Ask your health care provider about changing or stopping your regular medicines.  You may be prescribed an oral bowel prep. This involves drinking a large amount of medicated liquid, starting the day before your procedure. The liquid will cause you to have multiple loose stools until your stool is almost clear or light green. This cleans out your colon in preparation for the procedure.  Do not eat or drink anything else once you have started the bowel prep, unless your health care provider tells you it is safe to do so.  Arrange for someone to drive you home after the procedure. PROCEDURE   You will be given medicine to help you relax (sedative).  You will lie on your side with your knees bent.  A long, flexible tube with a light and camera on the end (colonoscope) will be inserted through the rectum and into the colon. The camera sends video back to a computer screen as it moves through the colon. The colonoscope also releases carbon dioxide gas to inflate the colon. This helps your health care provider see the area better.  During the exam, your health care provider may take a small tissue sample (biopsy) to be examined under a microscope if any abnormalities are found.  The exam is finished when the entire colon has been viewed. AFTER THE PROCEDURE   Do not drive for 24 hours after the exam.  You may have a small amount of blood in your stool.  You may pass moderate amounts of gas and have mild abdominal cramping or bloating. This is caused by the gas used to inflate your colon during the exam.  Ask when your test results will be ready and how you will get your results. Make sure you get your test results.   This information is not intended to replace advice given to you by your health care provider. Make sure you discuss any questions you have with your health care provider.   Document Released: 07/05/2000 Document  Revised: 04/28/2013 Document Reviewed: 03/15/2013 Elsevier Interactive Patient Education [employer].

## 2015-06-29 NOTE — Progress Notes (Signed)
MRN: [id number]  Subjective:   [name] is a 54 y.o. male presenting for annual physical exam.  Medical care team includes: PCP: No PCP Per Patient Vision: Has yearly eye exams with [facility]. Dental: Has not gone, has Designer, fashion/clothing. Preventative: No colonoscopy yet.  Immunizations: Flu shot 04/2015, TDAP 10/25/2013  Patient works as an Environmental consultant. He lives with his wife and 2 children. He has 2 other adult children. Patient smokes 1-2 cigarettes every other day. He does this socially and is not interested in quitting. Denies alcohol or drug use. He eats very healthily and exercises regularly.   Letroy does not have any active problems on his problem list.  Sterling has a current medication list which includes the following prescription(s): meloxicam. He has No Known Allergies.  Jeffie  has a past medical history of Arthritis. Also  has past surgical history that includes Neck surgery.  His family history includes Cancer in his father. Father passed away from prostate cancer in his 37's.  Review of Systems  Constitutional: Negative for fever, chills, weight loss, malaise/fatigue and diaphoresis.  HENT: Negative for congestion, ear discharge, ear pain, hearing loss, nosebleeds, sore throat and tinnitus.   Eyes: Negative for blurred vision, double vision, photophobia, pain, discharge and redness.  Respiratory: Negative for cough, shortness of breath and wheezing.   Cardiovascular: Negative for chest pain, palpitations and leg swelling.  Gastrointestinal: Negative for nausea, vomiting, abdominal pain, diarrhea, constipation and blood in stool.  Genitourinary: Negative for dysuria, urgency, frequency, hematuria and flank pain.  Musculoskeletal: Negative for myalgias, back pain and joint pain.  Skin: Negative for itching and rash.  Neurological: Negative for dizziness, tingling, seizures, loss of consciousness, weakness and headaches.    Endo/Heme/Allergies: Negative for polydipsia.  Psychiatric/Behavioral: Negative for depression, suicidal ideas, hallucinations, memory loss and substance abuse. The patient is not nervous/anxious and does not have insomnia.    Objective:   Vitals: BP 100/65 mmHg  Pulse 65  Temp(Src) 98.4 F (36.9 C) (Oral)  Resp 16  Ht 5\' 9"  (1.753 m)  Wt 155 lb (70.308 kg)  BMI 22.88 kg/m2  SpO2 99%  Physical Exam  Constitutional: He is oriented to person, place, and time. He appears well-developed and well-nourished.  HENT:  TM's intact bilaterally, no effusions or erythema. Nasal turbinates pink and moist, nasal passages patent. No sinus tenderness. Oropharynx clear, mucous membranes moist, dentition in good repair.  Eyes: Conjunctivae and EOM are normal. Pupils are equal, round, and reactive to light. Right eye exhibits no discharge. Left eye exhibits no discharge. No scleral icterus.  Neck: Normal range of motion. Neck supple. No thyromegaly present.  Cardiovascular: Normal rate, regular rhythm and intact distal pulses.  Exam reveals no gallop and no friction rub.   No murmur heard. Pulmonary/Chest: No stridor. No respiratory distress. He has no wheezes. He has no rales.  Abdominal: Soft. Bowel sounds are normal. He exhibits no distension and no mass. There is no tenderness.  Musculoskeletal: Normal range of motion. He exhibits no edema or tenderness.  Lymphadenopathy:    He has no cervical adenopathy.  Neurological: He is alert and oriented to person, place, and time. He has normal reflexes.  Skin: Skin is warm and dry. No rash noted. No erythema. No pallor.  Psychiatric: He has a normal mood and affect.   Assessment and Plan :   1. Annual physical exam - Medically healthy, labs pending - Discussed healthy lifestyle, diet, exercise, preventative care, vaccinations,  and addressed patient's concerns.  - Patient is going to contact his insurance and check for coverage of  colonoscopy.  2. Family history of prostate cancer in father - Screening PSA  Jay Eagles, PA-C [facility] [phone] 06/29/2015  2:35 PM

## 2015-06-30 LAB — PSA: PSA: 1.78 ng/mL (ref <=4.00)

## 2015-10-18 ENCOUNTER — Observation Stay (HOSPITAL_COMMUNITY)
Admission: EM | Admit: 2015-10-18 | Discharge: 2015-10-19 | Disposition: A | Discharge status: Home / Self Care | Payer: BLUE CROSS/BLUE SHIELD | Attending: Surgery | Admitting: Surgery

## 2015-10-18 ENCOUNTER — Observation Stay (HOSPITAL_COMMUNITY): Payer: BLUE CROSS/BLUE SHIELD | Admitting: Registered Nurse

## 2015-10-18 ENCOUNTER — Emergency Department (HOSPITAL_COMMUNITY): Payer: BLUE CROSS/BLUE SHIELD

## 2015-10-18 ENCOUNTER — Encounter (HOSPITAL_COMMUNITY): Payer: Self-pay | Admitting: Emergency Medicine

## 2015-10-18 ENCOUNTER — Encounter (HOSPITAL_COMMUNITY)
Admission: EM | Disposition: A | Discharge status: Home / Self Care | Payer: Self-pay | Source: Home / Self Care | Attending: Emergency Medicine

## 2015-10-18 DIAGNOSIS — M1990 Unspecified osteoarthritis, unspecified site: Secondary | ICD-10-CM | POA: Diagnosis not present

## 2015-10-18 DIAGNOSIS — Z791 Long term (current) use of non-steroidal anti-inflammatories (NSAID): Secondary | ICD-10-CM | POA: Diagnosis not present

## 2015-10-18 DIAGNOSIS — K3580 Unspecified acute appendicitis: Principal | ICD-10-CM | POA: Diagnosis present

## 2015-10-18 DIAGNOSIS — Z87891 Personal history of nicotine dependence: Secondary | ICD-10-CM | POA: Diagnosis not present

## 2015-10-18 HISTORY — PX: LAPAROSCOPIC APPENDECTOMY: SHX408

## 2015-10-18 LAB — COMPREHENSIVE METABOLIC PANEL
ALBUMIN: 4.2 g/dL (ref 3.5–5.0)
ALK PHOS: 62 U/L (ref 38–126)
ALT: 20 U/L (ref 17–63)
AST: 23 U/L (ref 15–41)
Anion gap: 8 (ref 5–15)
BILIRUBIN TOTAL: 0.8 mg/dL (ref 0.3–1.2)
BUN: 9 mg/dL (ref 6–20)
CALCIUM: 9.1 mg/dL (ref 8.9–10.3)
CO2: 26 mmol/L (ref 22–32)
Chloride: 103 mmol/L (ref 101–111)
Creatinine, Ser: 0.94 mg/dL (ref 0.61–1.24)
GFR calc Af Amer: >60 mL/min (ref >60)
GFR calc non Af Amer: >60 mL/min (ref >60)
GLUCOSE: 100 mg/dL — AB (ref 65–99)
Potassium: 4.3 mmol/L (ref 3.5–5.1)
Sodium: 137 mmol/L (ref 135–145)
TOTAL PROTEIN: 7.6 g/dL (ref 6.5–8.1)

## 2015-10-18 LAB — URINALYSIS, ROUTINE W REFLEX MICROSCOPIC
BILIRUBIN URINE: NEGATIVE
Glucose, UA: NEGATIVE mg/dL
HGB URINE DIPSTICK: NEGATIVE
KETONES UR: NEGATIVE mg/dL
Leukocytes, UA: NEGATIVE
NITRITE: NEGATIVE
PH: 7 (ref 5.0–8.0)
Protein, ur: NEGATIVE mg/dL
Specific Gravity, Urine: 1.011 (ref 1.005–1.030)

## 2015-10-18 LAB — CBC
HCT: 40.5 % (ref 39.0–52.0)
Hemoglobin: 14.1 g/dL (ref 13.0–17.0)
MCH: 29.1 pg (ref 26.0–34.0)
MCHC: 34.8 g/dL (ref 30.0–36.0)
MCV: 83.7 fL (ref 78.0–100.0)
Platelets: 335 10*3/uL (ref 150–400)
RBC: 4.84 MIL/uL (ref 4.22–5.81)
RDW: 12.7 % (ref 11.5–15.5)
WBC: 9.6 10*3/uL (ref 4.0–10.5)

## 2015-10-18 LAB — SURGICAL PCR SCREEN
MRSA, PCR: NEGATIVE
STAPHYLOCOCCUS AUREUS: NEGATIVE

## 2015-10-18 LAB — LIPASE, BLOOD: Lipase: 25 U/L (ref 11–51)

## 2015-10-18 SURGERY — APPENDECTOMY, LAPAROSCOPIC
Anesthesia: General | Site: Abdomen

## 2015-10-18 MED ORDER — ONDANSETRON HCL 4 MG/2ML IJ SOLN: 4.0000 mg | Freq: Once | INTRAMUSCULAR | Status: DC | PRN

## 2015-10-18 MED ORDER — ENOXAPARIN SODIUM 40 MG/0.4ML ~~LOC~~ SOLN
40.0000 mg | SUBCUTANEOUS | Status: DC
  Filled 2015-10-18: qty 0.4

## 2015-10-18 MED ORDER — IOPAMIDOL (ISOVUE-300) INJECTION 61%
100.0000 mL | Freq: Once | INTRAVENOUS | Status: AC | PRN
  Administered 2015-10-18: 100 mL via INTRAVENOUS

## 2015-10-18 MED ORDER — HEPARIN SODIUM (PORCINE) 5000 UNIT/ML IJ SOLN
5000.0000 [IU] | Freq: Three times a day (TID) | INTRAMUSCULAR | Status: DC
  Administered 2015-10-19: 5000 [IU] via SUBCUTANEOUS
  Filled 2015-10-18 (×5): qty 1

## 2015-10-18 MED ORDER — SUGAMMADEX SODIUM 200 MG/2ML IV SOLN
INTRAVENOUS | Status: DC | PRN
  Administered 2015-10-18: 200 mg via INTRAVENOUS

## 2015-10-18 MED ORDER — ONDANSETRON HCL 4 MG/2ML IJ SOLN: 4.0000 mg | Freq: Four times a day (QID) | INTRAMUSCULAR | Status: DC | PRN

## 2015-10-18 MED ORDER — ROCURONIUM BROMIDE 100 MG/10ML IV SOLN
INTRAVENOUS | Status: AC
  Filled 2015-10-18: qty 2

## 2015-10-18 MED ORDER — KCL IN DEXTROSE-NACL 20-5-0.45 MEQ/L-%-% IV SOLN
INTRAVENOUS | Status: DC
  Administered 2015-10-18: 18:00:00 via INTRAVENOUS
  Filled 2015-10-18 (×3): qty 1000

## 2015-10-18 MED ORDER — MIDAZOLAM HCL 2 MG/2ML IJ SOLN
INTRAMUSCULAR | Status: AC
  Filled 2015-10-18: qty 2

## 2015-10-18 MED ORDER — DEXTROSE 5 % IV SOLN
2.0000 g | Freq: Once | INTRAVENOUS | Status: AC
  Administered 2015-10-18: 2 g via INTRAVENOUS
  Filled 2015-10-18: qty 2

## 2015-10-18 MED ORDER — DEXAMETHASONE SODIUM PHOSPHATE 10 MG/ML IJ SOLN
INTRAMUSCULAR | Status: AC
  Filled 2015-10-18: qty 2

## 2015-10-18 MED ORDER — ACETAMINOPHEN 650 MG RE SUPP
650.0000 mg | Freq: Four times a day (QID) | RECTAL | Status: DC | PRN
Start: 2015-10-18 — End: 2015-10-18

## 2015-10-18 MED ORDER — IOHEXOL 300 MG/ML  SOLN
25.0000 mL | Freq: Once | INTRAMUSCULAR | Status: AC | PRN
  Administered 2015-10-18: 25 mL via ORAL

## 2015-10-18 MED ORDER — DEXAMETHASONE SODIUM PHOSPHATE 10 MG/ML IJ SOLN
INTRAMUSCULAR | Status: DC | PRN
  Administered 2015-10-18: 10 mg via INTRAVENOUS

## 2015-10-18 MED ORDER — MIDAZOLAM HCL 5 MG/5ML IJ SOLN
INTRAMUSCULAR | Status: DC | PRN
  Administered 2015-10-18: 2 mg via INTRAVENOUS

## 2015-10-18 MED ORDER — MEPERIDINE HCL 50 MG/ML IJ SOLN: 6.2500 mg | INTRAMUSCULAR | Status: DC | PRN

## 2015-10-18 MED ORDER — KCL IN DEXTROSE-NACL 20-5-0.45 MEQ/L-%-% IV SOLN
INTRAVENOUS | Status: AC
  Filled 2015-10-18: qty 1000

## 2015-10-18 MED ORDER — PROPOFOL 10 MG/ML IV BOLUS
INTRAVENOUS | Status: AC
  Filled 2015-10-18: qty 40

## 2015-10-18 MED ORDER — DEXAMETHASONE SODIUM PHOSPHATE 10 MG/ML IJ SOLN
INTRAMUSCULAR | Status: AC
  Filled 2015-10-18: qty 1

## 2015-10-18 MED ORDER — ONDANSETRON 4 MG PO TBDP: 4.0000 mg | ORAL_TABLET | Freq: Four times a day (QID) | ORAL | Status: DC | PRN

## 2015-10-18 MED ORDER — LACTATED RINGERS IV SOLN
INTRAVENOUS | Status: DC
  Administered 2015-10-18: 14:00:00 via INTRAVENOUS

## 2015-10-18 MED ORDER — PIPERACILLIN-TAZOBACTAM 3.375 G IVPB 30 MIN
3.3750 g | Freq: Three times a day (TID) | INTRAVENOUS | Status: AC
  Administered 2015-10-18: 3.375 g via INTRAVENOUS
  Filled 2015-10-18: qty 50

## 2015-10-18 MED ORDER — SUGAMMADEX SODIUM 200 MG/2ML IV SOLN
INTRAVENOUS | Status: AC
  Filled 2015-10-18: qty 2

## 2015-10-18 MED ORDER — PROPOFOL 10 MG/ML IV BOLUS
INTRAVENOUS | Status: DC | PRN
  Administered 2015-10-18: 150 mg via INTRAVENOUS

## 2015-10-18 MED ORDER — ONDANSETRON HCL 4 MG/2ML IJ SOLN
4.0000 mg | Freq: Once | INTRAMUSCULAR | Status: AC
  Administered 2015-10-18: 4 mg via INTRAVENOUS
  Filled 2015-10-18: qty 2

## 2015-10-18 MED ORDER — HYDROMORPHONE HCL 1 MG/ML IJ SOLN
0.2500 mg | INTRAMUSCULAR | Status: DC | PRN
  Administered 2015-10-18 (×2): 0.5 mg via INTRAVENOUS

## 2015-10-18 MED ORDER — FENTANYL CITRATE (PF) 100 MCG/2ML IJ SOLN
INTRAMUSCULAR | Status: DC | PRN
  Administered 2015-10-18: 50 ug via INTRAVENOUS
  Administered 2015-10-18: 150 ug via INTRAVENOUS
  Administered 2015-10-18: 50 ug via INTRAVENOUS

## 2015-10-18 MED ORDER — ACETAMINOPHEN 325 MG PO TABS: 650.0000 mg | ORAL_TABLET | Freq: Four times a day (QID) | ORAL | Status: DC | PRN

## 2015-10-18 MED ORDER — FENTANYL CITRATE (PF) 250 MCG/5ML IJ SOLN
INTRAMUSCULAR | Status: AC
  Filled 2015-10-18: qty 5

## 2015-10-18 MED ORDER — HYDROMORPHONE HCL 1 MG/ML IJ SOLN
1.0000 mg | INTRAMUSCULAR | Status: DC | PRN
  Administered 2015-10-18: 1 mg via INTRAVENOUS
  Filled 2015-10-18: qty 1

## 2015-10-18 MED ORDER — BUPIVACAINE-EPINEPHRINE (PF) 0.25% -1:200000 IJ SOLN
INTRAMUSCULAR | Status: DC | PRN
  Administered 2015-10-18: 30 mL

## 2015-10-18 MED ORDER — ONDANSETRON HCL 4 MG/2ML IJ SOLN
INTRAMUSCULAR | Status: AC
  Filled 2015-10-18: qty 2

## 2015-10-18 MED ORDER — MORPHINE SULFATE (PF) 4 MG/ML IV SOLN
4.0000 mg | Freq: Once | INTRAVENOUS | Status: AC
  Administered 2015-10-18: 4 mg via INTRAVENOUS
  Filled 2015-10-18: qty 1

## 2015-10-18 MED ORDER — MORPHINE SULFATE (PF) 2 MG/ML IV SOLN
1.0000 mg | INTRAVENOUS | Status: DC | PRN
  Administered 2015-10-18 – 2015-10-19 (×2): 1 mg via INTRAVENOUS
  Filled 2015-10-18 (×2): qty 1

## 2015-10-18 MED ORDER — SODIUM CHLORIDE 0.9 % IV SOLN
Freq: Once | INTRAVENOUS | Status: AC
  Administered 2015-10-18: 10:00:00 via INTRAVENOUS

## 2015-10-18 MED ORDER — LACTATED RINGERS IR SOLN
Status: DC | PRN
  Administered 2015-10-18: 1000 mL

## 2015-10-18 MED ORDER — LIDOCAINE HCL (CARDIAC) 20 MG/ML IV SOLN
INTRAVENOUS | Status: DC | PRN
  Administered 2015-10-18: 50 mg via INTRAVENOUS

## 2015-10-18 MED ORDER — METRONIDAZOLE IN NACL 5-0.79 MG/ML-% IV SOLN
500.0000 mg | Freq: Once | INTRAVENOUS | Status: AC
  Administered 2015-10-18: 500 mg via INTRAVENOUS
  Filled 2015-10-18: qty 100

## 2015-10-18 MED ORDER — HYDROMORPHONE HCL 1 MG/ML IJ SOLN
INTRAMUSCULAR | Status: AC
  Filled 2015-10-18: qty 1

## 2015-10-18 MED ORDER — ROCURONIUM BROMIDE 100 MG/10ML IV SOLN
INTRAVENOUS | Status: DC | PRN
  Administered 2015-10-18: 35 mg via INTRAVENOUS
  Administered 2015-10-18: 5 mg via INTRAVENOUS

## 2015-10-18 MED ORDER — SUCCINYLCHOLINE CHLORIDE 20 MG/ML IJ SOLN
INTRAMUSCULAR | Status: DC | PRN
  Administered 2015-10-18: 100 mg via INTRAVENOUS

## 2015-10-18 MED ORDER — ONDANSETRON HCL 4 MG/2ML IJ SOLN
INTRAMUSCULAR | Status: DC | PRN
  Administered 2015-10-18: 4 mg via INTRAVENOUS

## 2015-10-18 MED ORDER — HYDROCODONE-ACETAMINOPHEN 5-325 MG PO TABS
1.0000 | ORAL_TABLET | ORAL | Status: DC | PRN
  Administered 2015-10-19: 2 via ORAL
  Filled 2015-10-18: qty 2

## 2015-10-18 SURGICAL SUPPLY — 38 items
APPLIER CLIP ROT 10 11.4 M/L (STAPLE)
APR CLP MED LRG 11.4X10 (STAPLE)
BAG SPEC RTRVL 10 TROC 200 (ENDOMECHANICALS)
BAG SPEC RTRVL LRG 6X4 10 (ENDOMECHANICALS)
CABLE HIGH FREQUENCY MONO STRZ (ELECTRODE) ×1 IMPLANT
CLIP APPLIE ROT 10 11.4 M/L (STAPLE) IMPLANT
COVER SURGICAL LIGHT HANDLE (MISCELLANEOUS) ×1 IMPLANT
CUTTER FLEX LINEAR 45M (STAPLE) ×1 IMPLANT
DECANTER SPIKE VIAL GLASS SM (MISCELLANEOUS) ×1 IMPLANT
DRAPE LAPAROSCOPIC ABDOMINAL (DRAPES) ×2 IMPLANT
ELECT REM PT RETURN 9FT ADLT (ELECTROSURGICAL) ×2
ELECTRODE REM PT RTRN 9FT ADLT (ELECTROSURGICAL) ×1 IMPLANT
ENDOLOOP SUT PDS II  0 18 (SUTURE)
ENDOLOOP SUT PDS II 0 18 (SUTURE) IMPLANT
GLOVE BIOGEL M 8.0 STRL (GLOVE) ×7 IMPLANT
GOWN STRL REUS W/TWL XL LVL3 (GOWN DISPOSABLE) ×5 IMPLANT
KIT BASIN OR (CUSTOM PROCEDURE TRAY) ×2 IMPLANT
POUCH RETRIEVAL ECOSAC 10 (ENDOMECHANICALS) IMPLANT
POUCH RETRIEVAL ECOSAC 10MM (ENDOMECHANICALS)
POUCH SPECIMEN RETRIEVAL 10MM (ENDOMECHANICALS) IMPLANT
RELOAD 45 VASCULAR/THIN (ENDOMECHANICALS) IMPLANT
RELOAD STAPLE 45 2.5 WHT GRN (ENDOMECHANICALS) IMPLANT
RELOAD STAPLE 45 3.5 BLU ETS (ENDOMECHANICALS) IMPLANT
RELOAD STAPLE TA45 3.5 REG BLU (ENDOMECHANICALS) ×2 IMPLANT
SCISSORS LAP 5X45 EPIX DISP (ENDOMECHANICALS) ×1 IMPLANT
SCRUB PCMX 4 OZ (MISCELLANEOUS) ×2 IMPLANT
SET IRRIG TUBING LAPAROSCOPIC (IRRIGATION / IRRIGATOR) ×2 IMPLANT
SHEARS HARMONIC ACE PLUS 45CM (MISCELLANEOUS) ×2 IMPLANT
SLEEVE XCEL OPT CAN 5 100 (ENDOMECHANICALS) ×1 IMPLANT
STAPLER VISISTAT 35W (STAPLE) ×1 IMPLANT
SUT VIC AB 4-0 SH 18 (SUTURE) ×2 IMPLANT
TOWEL OR 17X26 10 PK STRL BLUE (TOWEL DISPOSABLE) ×4 IMPLANT
TRAY FOLEY W/METER SILVER 14FR (SET/KITS/TRAYS/PACK) ×1 IMPLANT
TRAY LAPAROSCOPIC (CUSTOM PROCEDURE TRAY) ×2 IMPLANT
TROCAR BLADELESS OPT 5 100 (ENDOMECHANICALS) ×2 IMPLANT
TROCAR XCEL BLUNT TIP 100MML (ENDOMECHANICALS) ×2 IMPLANT
TROCAR XCEL NON-BLD 11X100MML (ENDOMECHANICALS) IMPLANT
TUBING INSUF HEATED (TUBING) ×1 IMPLANT

## 2015-10-18 NOTE — Anesthesia Procedure Notes (Signed)
Procedure Name: Intubation Date/Time: 10/18/2015 3:50 PM Performed by: Clarita Mcelvain, Virgel Gess Pre-anesthesia Checklist: Patient identified, Emergency Drugs available, Suction available, Patient being monitored and Timeout performed Patient Re-evaluated:Patient Re-evaluated prior to inductionOxygen Delivery Method: Circle system utilized Preoxygenation: Pre-oxygenation with 100% oxygen Intubation Type: IV induction Ventilation: Mask ventilation without difficulty Laryngoscope Size: Mac and 4 Grade View: Grade II Tube type: Oral Tube size: 7.5 mm Number of attempts: 1 Airway Equipment and Method: Stylet Placement Confirmation: ETT inserted through vocal cords under direct vision,  positive ETCO2,  CO2 detector and breath sounds checked- equal and bilateral Secured at: 22 cm Tube secured with: Tape Dental Injury: Teeth and Oropharynx as per pre-operative assessment

## 2015-10-18 NOTE — ED Notes (Signed)
Pt requesting Pain meds - RN aware

## 2015-10-18 NOTE — ED Notes (Signed)
Bed: WA15 Expected date:  Expected time:  Means of arrival:  Comments: 

## 2015-10-18 NOTE — Op Note (Signed)
Surgeon: Kaylyn Lim, MD, FACS  Asst:  Dyann Kief, MD  Anes:  general  Procedure: Laparoscopic appendectomy  Diagnosis: Acute appendicitis  Complications: none  EBL:   minimal cc  Drains: none  Description of Procedure:  The patient was taken to OR 6 at [facility].  After anesthesia was administered and the patient was prepped a timeout was performed.  Access achieved with a Veress needle through the umbilicus and then with introduction of 5 mm in the midline.  A five mm was placed in the lower midline and an 11 in the left lower quadrant.  The appendix was suppurative and indurated but was able to mobilize and transect mesentery with a Harmonic scalpel.  The base was isolated and divided with a 4.5 mm Ethicon stapler with a blue load.  The appendix was placed in a bag and removed through the left lower quadrant.  Appendiceal stump was dry.  The abdomen was deflated and the trocar sites were closed with 4-0 vicryl and Liquiban.    The patient tolerated the procedure well and was taken to the PACU in stable condition.     Matt B. Hassell Done, [place], [facility], [place]

## 2015-10-18 NOTE — H&P (Signed)
Chief Complaint: abdominal pain HPI: Jay Osborn is a healthy 55 year old male who presents to [facility] with abdominal pain. Duration of symptoms is 3 days.  Onset was sudden. Coarse is worsening, acutely worsened last night. Time pattern is constant.  No aggravating or alleviating factors.  Modifying factors include; tylenol and antacids.  Had an episode of diarrhea, now constipated.  Denies nausea, vomiting, fever, chills or sweats.  Endorses to anorexia.  Last oral intake was 2PM yesterday.  ED work up revealed normal WBC, renal and liver function, normal UA.  CT of abdomen and pelvis revealed acute appendicitis.  We were therefore asked to evaluate.   Past Medical History  Diagnosis Date  . Arthritis     Past Surgical History  Procedure Laterality Date  . Neck surgery      Family History  Problem Relation Age of Onset  . Cancer Father     prostate   Social History:  reports that he quit smoking about 2 years ago. He quit smokeless tobacco use about 2 years ago. His smokeless tobacco use included Chew. He reports that he does not drink alcohol or use illicit drugs.  Allergies: No Known Allergies   (Not in a hospital admission)  Results for orders placed or performed during the hospital encounter of 10/18/15 (from the past 48 hour(s))  Lipase, blood     Status: None   Collection Time: 10/18/15  2:57 AM  Result Value Ref Range   Lipase 25 11 - 51 U/L  Comprehensive metabolic panel     Status: Abnormal   Collection Time: 10/18/15  2:57 AM  Result Value Ref Range   Sodium 137 135 - 145 mmol/L   Potassium 4.3 3.5 - 5.1 mmol/L   Chloride 103 101 - 111 mmol/L   CO2 26 22 - 32 mmol/L   Glucose, Bld 100 (H) 65 - 99 mg/dL   BUN 9 6 - 20 mg/dL   Creatinine, Ser 0.94 0.61 - 1.24 mg/dL   Calcium 9.1 8.9 - 10.3 mg/dL   Total Protein 7.6 6.5 - 8.1 g/dL   Albumin 4.2 3.5 - 5.0 g/dL   AST 23 15 - 41 U/L   ALT 20 17 - 63 U/L   Alkaline Phosphatase 62 38 - 126 U/L   Total Bilirubin  0.8 0.3 - 1.2 mg/dL   GFR calc non Af Amer >60 >60 mL/min   GFR calc Af Amer >60 >60 mL/min    Comment: (NOTE) The eGFR has been calculated using the CKD EPI equation. This calculation has not been validated in all clinical situations. eGFR's persistently <60 mL/min signify possible Chronic Kidney Disease.    Anion gap 8 5 - 15  CBC     Status: None   Collection Time: 10/18/15  2:57 AM  Result Value Ref Range   WBC 9.6 4.0 - 10.5 K/uL   RBC 4.84 4.22 - 5.81 MIL/uL   Hemoglobin 14.1 13.0 - 17.0 g/dL   HCT 40.5 39.0 - 52.0 %   MCV 83.7 78.0 - 100.0 fL   MCH 29.1 26.0 - 34.0 pg   MCHC 34.8 30.0 - 36.0 g/dL   RDW 12.7 11.5 - 15.5 %   Platelets 335 150 - 400 K/uL  Urinalysis, Routine w reflex microscopic (not at [facility])     Status: None   Collection Time: 10/18/15  6:46 AM  Result Value Ref Range   Color, Urine YELLOW YELLOW   APPearance CLEAR CLEAR   Specific Gravity,  Urine 1.011 1.005 - 1.030   pH 7.0 5.0 - 8.0   Glucose, UA NEGATIVE NEGATIVE mg/dL   Hgb urine dipstick NEGATIVE NEGATIVE   Bilirubin Urine NEGATIVE NEGATIVE   Ketones, ur NEGATIVE NEGATIVE mg/dL   Protein, ur NEGATIVE NEGATIVE mg/dL   Nitrite NEGATIVE NEGATIVE   Leukocytes, UA NEGATIVE NEGATIVE    Comment: MICROSCOPIC NOT DONE ON URINES WITH NEGATIVE PROTEIN, BLOOD, LEUKOCYTES, NITRITE, OR GLUCOSE <1000 mg/dL.   Ct Abdomen Pelvis W Contrast  10/18/2015  CLINICAL DATA:  55 year old with generalized abdominal pain for 3 days. EXAM: CT ABDOMEN AND PELVIS WITH CONTRAST TECHNIQUE: Multidetector CT imaging of the abdomen and pelvis was performed using the standard protocol following bolus administration of intravenous contrast. CONTRAST:  118m ISOVUE-300 IOPAMIDOL (ISOVUE-300) INJECTION 61% COMPARISON:  06/01/2013 FINDINGS: Lower chest: Small nodular density in the right middle lobe on sequence 6, image 3 measures 3 mm. Otherwise, the lung bases are clear. Hepatobiliary: Normal appearance of the liver and gallbladder  without biliary dilatation. Portal venous system is patent. Pancreas: Normal appearance of the pancreas without inflammation or duct dilatation. Spleen: Normal appearance of the spleen without enlargement. Adrenals/Urinary Tract: Normal adrenal glands. Normal appearance of the kidneys and urinary bladder. No hydronephrosis. Stomach/Bowel: Normal appearance of stomach and duodenum. The appendix is mildly distended measuring 9 mm with with wall enhancement and surrounding inflammatory changes. Findings are suggestive for acute appendix inflammation. Trace amount of fluid around the inflamed appendix and in the pelvis. No evidence for an abscess collection. No free air. Vascular/Lymphatic: Normal appearance of the aorta and iliac arteries. There is no significant abdominal or pelvic lymphadenopathy. Reproductive: Normal appearance of the prostate and seminal vesicles. Other: Trace amount of fluid around the appendix and in the pelvis. Musculoskeletal: Well-circumscribed sclerotic lesion in the right ilium on sequence 2, image 52, measuring 1.4 cm. This structure has some internal sclerosis as well. Favor a benign etiology. There is an additional small sclerotic area along the medial right ilium. IMPRESSION: Acute appendicitis. The appendix is distended with surrounding inflammatory changes. Trace amount of fluid in the right lower quadrant and pelvis. No evidence for an abscess collection. Indeterminate 3 mm nodule in the right middle lobe. No follow-up needed if patient is low-risk. Non-contrast chest CT can be considered in 12 months if patient is high-risk. This recommendation follows the consensus statement: Guidelines for Management of Incidental Pulmonary Nodules Detected on CT Images:From the Fleischner Society 2017; published online before print (10.1148/[id number]. These results were called by telephone at the time of interpretation on 10/18/2015 at 9:05 am to [facility] PA-C , who verbally  acknowledged these results. Electronically Signed   By: AMarkus DaftM.D.   On: 10/18/2015 09:08    Review of Systems  Constitutional: Positive for malaise/fatigue. Negative for fever, chills, weight loss and diaphoresis.  Eyes: Negative for blurred vision, double vision, photophobia, pain and discharge.  Respiratory: Negative for cough, hemoptysis, sputum production, shortness of breath and wheezing.   Cardiovascular: Negative for chest pain, palpitations, orthopnea, claudication, leg swelling and PND.  Gastrointestinal: Positive for heartburn, abdominal pain and constipation. Negative for nausea, vomiting, diarrhea, blood in stool and melena.  Genitourinary: Negative for dysuria, urgency, frequency, hematuria and flank pain.  Musculoskeletal: Negative for myalgias, back pain, joint pain, falls and neck pain.  Neurological: Negative for dizziness, tingling, tremors, sensory change, speech change, focal weakness, seizures, weakness and headaches.  Psychiatric/Behavioral: Negative for depression, suicidal ideas, hallucinations, memory loss and substance abuse. The patient is not  nervous/anxious and does not have insomnia.     Blood pressure 125/85, pulse 61, temperature 98.1 F (36.7 C), temperature source Oral, resp. rate 16, SpO2 100 %. Physical Exam  Constitutional: He is oriented to person, place, and time. He appears well-developed and well-nourished. No distress.  HENT:  Head: Normocephalic and atraumatic.  Mouth/Throat: No oropharyngeal exudate.  Eyes: Right eye exhibits no discharge. Left eye exhibits no discharge. No scleral icterus.  Cardiovascular: Normal rate, regular rhythm, normal heart sounds and intact distal pulses.  Exam reveals no gallop and no friction rub.   No murmur heard. Respiratory: Effort normal and breath sounds normal. No respiratory distress. He has no wheezes. He has no rales. He exhibits no tenderness.  GI: Soft. Bowel sounds are normal.  Diffuse tenderness  with voluntary guarding   Musculoskeletal: Normal range of motion. He exhibits no edema or tenderness.  Neurological: He is alert and oriented to person, place, and time.  Skin: Skin is warm and dry. No rash noted. He is not diaphoretic. No erythema. No pallor.  Psychiatric: He has a normal mood and affect. His behavior is normal. Judgment and thought content normal.     Assessment/Plan Acute appendicitis-to OR for laparoscopic appendectomy.  Surgical risks discussed including infection, bleeding, open surgery, injury to surrounding structures requiring additional surgeries, heart attack, DVT/PE, stroke and respiratory compromise.  The patient verbalizes understanding and wishes to proceed. ID-rocephin/flagyl RML pulmonary nodule-non smoker, low risk, OP PCP follow up  FEN-NPO, IVF VTE prophylaxis-SCD, post op lovenox Dispo-to OR, then admit to floor   Erby Pian, NP  Pager [phone] 10/18/2015, 10:21 AM

## 2015-10-18 NOTE — Anesthesia Preprocedure Evaluation (Signed)
Anesthesia Evaluation  Patient identified by MRN, date of birth, ID band Patient awake    Reviewed: Allergy & Precautions, NPO status , Patient's Chart, lab work & pertinent test results  Airway Mallampati: I  TM Distance: >3 FB Neck ROM: Full    Dental   Pulmonary former smoker,    Pulmonary exam normal        Cardiovascular Normal cardiovascular exam     Neuro/Psych    GI/Hepatic   Endo/Other    Renal/GU      Musculoskeletal   Abdominal   Peds  Hematology   Anesthesia Other Findings   Reproductive/Obstetrics                             Anesthesia Physical Anesthesia Plan  ASA: II and emergent  Anesthesia Plan: General   Post-op Pain Management:    Induction: Intravenous, Rapid sequence and Cricoid pressure planned  Airway Management Planned: Oral ETT  Additional Equipment:   Intra-op Plan:   Post-operative Plan: Extubation in OR  Informed Consent: I have reviewed the patients History and Physical, chart, labs and discussed the procedure including the risks, benefits and alternatives for the proposed anesthesia with the patient or authorized representative who has indicated his/her understanding and acceptance.     Plan Discussed with: CRNA and Surgeon  Anesthesia Plan Comments:         Anesthesia Quick Evaluation  

## 2015-10-18 NOTE — Anesthesia Postprocedure Evaluation (Signed)
Anesthesia Post Note  Patient: Jay Osborn  Procedure(s) Performed: Procedure(s) (LRB): APPENDECTOMY LAPAROSCOPIC (N/A)  Patient location during evaluation: PACU Anesthesia Type: General Level of consciousness: awake and alert Pain management: pain level controlled Vital Signs Assessment: post-procedure vital signs reviewed and stable Respiratory status: spontaneous breathing, nonlabored ventilation, respiratory function stable and patient connected to nasal cannula oxygen Cardiovascular status: blood pressure returned to baseline and stable Postop Assessment: no signs of nausea or vomiting Anesthetic complications: no    Last Vitals:  Filed Vitals:   10/18/15 1745 10/18/15 1750  BP: 115/73 113/73  Pulse: 75 74  Temp: 36.8 C   Resp: 12 10    Last Pain:  Filed Vitals:   10/18/15 1753  PainSc: [place]                 Saida Lonon DAVID

## 2015-10-18 NOTE — ED Notes (Signed)
Pt states that he has had generalized abdominal pain x 3 days. Denies N/V/D. Alert and oriented.

## 2015-10-18 NOTE — ED Provider Notes (Signed)
CSN: [id number]     Arrival date & time 10/18/15  [id number] History   First MD Initiated Contact with Patient 10/18/15 [phone]     Chief Complaint  Patient presents with  . Abdominal Pain     (Consider location/radiation/quality/duration/timing/severity/associated sxs/prior Treatment) HPI Jay Osborn is a 55 y.o. male with no medical problems, presents to emergency room complaining of abdominal pain. Patient states pain started 3 days ago. He states pain is mainly in the lower abdomen. States his stools have been hard. He denies any nausea and vomiting. Last bowel movement yesterday. It was small and hard. Denies blood in his stool or emesis. Denies fever chills malaise. No hx of similar pain. States he took "antiacids" which did not help. No pain with eating. Pain worse with movement. No prior abdominal surgeries. No other complaints.   Past Medical History  Diagnosis Date  . Arthritis    Past Surgical History  Procedure Laterality Date  . Neck surgery     Family History  Problem Relation Age of Onset  . Cancer Father     prostate   Social History  Substance Use Topics  . Smoking status: Former Smoker    Quit date: 03/18/2013  . Smokeless tobacco: Former Systems developer    Types: [place] date: 03/18/2013  . Alcohol Use: No    Review of Systems  Constitutional: Negative for fever and chills.  Respiratory: Negative for cough, chest tightness and shortness of breath.   Cardiovascular: Negative for chest pain, palpitations and leg swelling.  Gastrointestinal: Positive for abdominal pain. Negative for nausea, vomiting, diarrhea and abdominal distention.  Genitourinary: Negative for dysuria, urgency, frequency and hematuria.  Musculoskeletal: Negative for myalgias, arthralgias, neck pain and neck stiffness.  Skin: Negative for rash.  Allergic/Immunologic: Negative for immunocompromised state.  Neurological: Negative for dizziness, weakness, light-headedness, numbness and headaches.   All other systems reviewed and are negative.     Allergies  Review of patient's allergies indicates no known allergies.  Home Medications   Prior to Admission medications   Medication Sig Start Date End Date Taking? Authorizing Provider  MELOXICAM PO Take by mouth.    Historical Provider, MD   BP 116/80 mmHg  Pulse 63  Temp(Src) 98.1 F (36.7 C) (Oral)  Resp 17  SpO2 100% Physical Exam  Constitutional: He is oriented to person, place, and time. He appears well-developed and well-nourished. No distress.  HENT:  Head: Normocephalic and atraumatic.  Eyes: Conjunctivae are normal.  Neck: Neck supple.  Cardiovascular: Normal rate, regular rhythm and normal heart sounds.   Pulmonary/Chest: Effort normal. No respiratory distress. He has no wheezes. He has no rales.  Abdominal: Soft. Bowel sounds are normal. He exhibits no distension. There is tenderness. There is no rebound.  LLQ and RLQ tenderness  Musculoskeletal: He exhibits no edema.  Neurological: He is alert and oriented to person, place, and time.  Skin: Skin is warm and dry.  Nursing note and vitals reviewed.   ED Course  Procedures (including critical care time) Labs Review Labs Reviewed  COMPREHENSIVE METABOLIC PANEL - Abnormal; Notable for the following:    Glucose, Bld 100 (*)    All other components within normal limits  SURGICAL PCR SCREEN  LIPASE, BLOOD  CBC  URINALYSIS, ROUTINE W REFLEX MICROSCOPIC (NOT AT [facility])    Imaging Review Ct Abdomen Pelvis W Contrast  10/18/2015  CLINICAL DATA:  55 year old with generalized abdominal pain for 3 days. EXAM: CT ABDOMEN AND PELVIS WITH CONTRAST  TECHNIQUE: Multidetector CT imaging of the abdomen and pelvis was performed using the standard protocol following bolus administration of intravenous contrast. CONTRAST:  121mL ISOVUE-300 IOPAMIDOL (ISOVUE-300) INJECTION 61% COMPARISON:  06/01/2013 FINDINGS: Lower chest: Small nodular density in the right middle lobe on  sequence 6, image 3 measures 3 mm. Otherwise, the lung bases are clear. Hepatobiliary: Normal appearance of the liver and gallbladder without biliary dilatation. Portal venous system is patent. Pancreas: Normal appearance of the pancreas without inflammation or duct dilatation. Spleen: Normal appearance of the spleen without enlargement. Adrenals/Urinary Tract: Normal adrenal glands. Normal appearance of the kidneys and urinary bladder. No hydronephrosis. Stomach/Bowel: Normal appearance of stomach and duodenum. The appendix is mildly distended measuring 9 mm with with wall enhancement and surrounding inflammatory changes. Findings are suggestive for acute appendix inflammation. Trace amount of fluid around the inflamed appendix and in the pelvis. No evidence for an abscess collection. No free air. Vascular/Lymphatic: Normal appearance of the aorta and iliac arteries. There is no significant abdominal or pelvic lymphadenopathy. Reproductive: Normal appearance of the prostate and seminal vesicles. Other: Trace amount of fluid around the appendix and in the pelvis. Musculoskeletal: Well-circumscribed sclerotic lesion in the right ilium on sequence 2, image 52, measuring 1.4 cm. This structure has some internal sclerosis as well. Favor a benign etiology. There is an additional small sclerotic area along the medial right ilium. IMPRESSION: Acute appendicitis. The appendix is distended with surrounding inflammatory changes. Trace amount of fluid in the right lower quadrant and pelvis. No evidence for an abscess collection. Indeterminate 3 mm nodule in the right middle lobe. No follow-up needed if patient is low-risk. Non-contrast chest CT can be considered in 12 months if patient is high-risk. This recommendation follows the consensus statement: Guidelines for Management of Incidental Pulmonary Nodules Detected on CT Images:From the Fleischner Society 2017; published online before print (10.1148/radiol.[id number]:[id number]). These  results were called by telephone at the time of interpretation on 10/18/2015 at 9:05 am to [facility], PA-C , who verbally acknowledged these results. Electronically Signed   By: Markus Daft M.D.   On: 10/18/2015 09:08   I have personally reviewed and evaluated these images and lab results as part of my medical decision-making.   EKG Interpretation None      MDM   Final diagnoses:  Acute appendicitis, unspecified acute appendicitis type   Patient with lower abdominal pain, onset 3 days ago. Worsening. No peritoneal signs on exam. No prior abdominal pain or surgeries. Labs unremarkable. I'll get CT abdomen and pelvis.  9:30 AM CT abdomen and pelvis showing acute appendicitis. Will start antibiotics, fluids, call general surgery  Spoke with general surgery. They will admit patient.  Filed Vitals:   10/18/15 0241 10/18/15 0642 10/18/15 0903 10/18/15 1117  BP: 105/79 116/80 125/85 112/67  Pulse: 78 63 61 61  Temp: 98.2 F (36.8 C) 98.1 F (36.7 C)  97.8 F (36.6 C)  TempSrc: Oral Oral  Oral  Resp: 18 17 16 16   SpO2: 100% 100% 100% 100%       Jeannett Senior, PA-C 10/18/15 [place], MD 10/19/15 [phone]

## 2015-10-18 NOTE — ED Notes (Signed)
Pt can go up at 11;05.

## 2015-10-18 NOTE — Transfer of Care (Signed)
Immediate Anesthesia Transfer of Care Note  Patient: Jay Osborn  Procedure(s) Performed: Procedure(s): APPENDECTOMY LAPAROSCOPIC (N/A)  Patient Location: PACU  Anesthesia Type:General  Level of Consciousness:  sedated, patient cooperative and responds to stimulation  Airway & Oxygen Therapy:Patient Spontanous Breathing and Patient connected to face mask oxgen  Post-op Assessment:  Report given to PACU RN and Post -op Vital signs reviewed and stable  Post vital signs:  Reviewed and stable  Last Vitals:  Filed Vitals:   10/18/15 0903 10/18/15 1117  BP: 125/85 112/67  Pulse: 61 61  Temp:  36.6 C  Resp: 16 16    Complications: No apparent anesthesia complications

## 2015-10-19 ENCOUNTER — Encounter (HOSPITAL_COMMUNITY): Payer: Self-pay | Admitting: Surgery

## 2015-10-19 LAB — CBC
HEMATOCRIT: 39 % (ref 39.0–52.0)
Hemoglobin: 13.2 g/dL (ref 13.0–17.0)
MCH: 28.5 pg (ref 26.0–34.0)
MCHC: 33.8 g/dL (ref 30.0–36.0)
MCV: 84.2 fL (ref 78.0–100.0)
Platelets: 208 10*3/uL (ref 150–400)
RBC: 4.63 MIL/uL (ref 4.22–5.81)
RDW: 12.6 % (ref 11.5–15.5)
WBC: 9.6 10*3/uL (ref 4.0–10.5)

## 2015-10-19 MED ORDER — HYDROCODONE-ACETAMINOPHEN 5-325 MG PO TABS: 1.0000 | ORAL_TABLET | ORAL | Status: DC | PRN

## 2015-10-19 NOTE — Discharge Instructions (Signed)

## 2015-10-20 NOTE — Discharge Summary (Signed)
  Physician Discharge Summary  Patient ID: Jay Osborn MRN: [id number] DOB/AGE: [birth year] 55 y.o.  Admit date: 10/18/2015 Discharge date: 10/19/2015  Admitting Diagnosis: Acute appendicitis  Discharge Diagnosis Patient Active Problem List   Diagnosis Date Noted  . Acute appendicitis 10/18/2015    Consultants none  Imaging: No results found.  Procedures Laparoscopic appendectomy   HPI: Jay Osborn is a healthy 55 year old male who presents to [facility] with abdominal pain. Duration of symptoms is 3 days. Onset was sudden. Coarse is worsening, acutely worsened last night. Time pattern is constant. No aggravating or alleviating factors. Modifying factors include; tylenol and antacids. Had an episode of diarrhea, now constipated. Denies nausea, vomiting, fever, chills or sweats. Endorses to anorexia. Last oral intake was 2PM yesterday.  ED work up revealed normal WBC, renal and liver function, normal UA. CT of abdomen and pelvis revealed acute appendicitis. We were therefore asked to evaluate.   Hospital Course:  Patient was admitted and underwent procedure listed above.  Tolerated procedure well and was transferred to the floor.  Diet was advanced as tolerated.  On POD#1, the patient was voiding well, tolerating diet, ambulating well, pain well controlled, vital signs stable, incisions c/d/i and felt stable for discharge home.  Medication risks, benefits and therapeutic alternatives were reviewed with the patient.  He verbalizes understanding.  Patient will follow up in our office in 2 weeks and knows to call with questions or concerns.  Physical Exam: General:  Alert, NAD, pleasant, comfortable Abd:  Soft, ND, mild tenderness, incisions C/D/I    Medication List    TAKE these medications        acetaminophen 500 MG tablet  Commonly known as:  TYLENOL  Take 500 mg by mouth every 6 (six) hours as needed for moderate pain or headache.     HYDROcodone-acetaminophen  5-325 MG tablet  Commonly known as:  NORCO/VICODIN  Take 1-2 tablets by mouth every 4 (four) hours as needed for moderate pain.     meloxicam 15 MG tablet  Commonly known as:  MOBIC  Take 15 mg by mouth daily as needed for pain.             Follow-up Information    Follow up with [facility] On 11/07/2015.   Specialty:  General Surgery   Why:  arrive by 1:45PM for a 2:15PM post op check   Contact information:   [place] [address] [phone]       Signed: Erby Pian, [facility] [phone]  10/20/2015, 1:09 PM

## 2016-01-12 ENCOUNTER — Ambulatory Visit (INDEPENDENT_AMBULATORY_CARE_PROVIDER_SITE_OTHER): Payer: BLUE CROSS/BLUE SHIELD | Admitting: Physician Assistant

## 2016-01-12 VITALS — BP 100/68 | HR 59 | Temp 98.3°F | Ht 68.0 in | Wt 152.0 lb

## 2016-01-12 DIAGNOSIS — R21 Rash and other nonspecific skin eruption: Secondary | ICD-10-CM | POA: Diagnosis not present

## 2016-01-12 DIAGNOSIS — L988 Other specified disorders of the skin and subcutaneous tissue: Secondary | ICD-10-CM | POA: Diagnosis not present

## 2016-01-12 DIAGNOSIS — L282 Other prurigo: Secondary | ICD-10-CM | POA: Diagnosis not present

## 2016-01-12 DIAGNOSIS — R238 Other skin changes: Secondary | ICD-10-CM

## 2016-01-12 MED ORDER — TRIAMCINOLONE ACETONIDE 0.5 % EX OINT: 1.0000 "application " | TOPICAL_OINTMENT | Freq: Two times a day (BID) | CUTANEOUS | Status: DC

## 2016-01-12 MED ORDER — HYDROXYZINE HCL 25 MG PO TABS: 12.5000 mg | ORAL_TABLET | Freq: Three times a day (TID) | ORAL | Status: DC | PRN

## 2016-01-12 MED ORDER — RANITIDINE HCL 150 MG PO TABS: 150.0000 mg | ORAL_TABLET | Freq: Two times a day (BID) | ORAL | Status: DC

## 2016-01-12 MED ORDER — CETIRIZINE HCL 10 MG PO TABS: 10.0000 mg | ORAL_TABLET | Freq: Every day | ORAL | Status: DC

## 2016-01-12 NOTE — Progress Notes (Signed)
[facility] [address], [place] [address] [phone]  Date:  01/12/2016   Name:  Jay Osborn   DOB:  [birth year]   MRN:  [id number]  PCP:  No PCP Per Patient   Chief Complaint  Patient presents with  . skin blisters    x 3 days  . Medication Refill    requests a Meloxicam  refill    History of Present Illness:  Jay Osborn is a 55 y.o. male patient who presents to [facility] for rash.   Blister that started on left foot, and neck, and arms that has progressed.  They are very pruritic, and worsened over the 3 days of their occurrence.  Patient denies any pain at these vesicles.  Attempts to not scratch.  No recent hx of UR symptoms.  No fever or body aches.  No recent medications.  Last took meloxicam 1 month ago.  He last cut his grass one week ago in sandals, though he says he has never changed this.  Hx of chicken pox.  No change in hygeine products.   Patient Active Problem List   Diagnosis Date Noted  . Acute appendicitis 10/18/2015    Past Medical History  Diagnosis Date  . Arthritis     Past Surgical History  Procedure Laterality Date  . Neck surgery    . Laparoscopic appendectomy N/A 10/18/2015    Procedure: APPENDECTOMY LAPAROSCOPIC;  Surgeon: Johnathan Hausen, MD;  Location: [facility];  Service: General;  Laterality: N/A;    Social History  Substance Use Topics  . Smoking status: Former Smoker    Quit date: 03/18/2013  . Smokeless tobacco: Former Systems developer    Types: [place] date: 03/18/2013  . Alcohol Use: No    Family History  Problem Relation Age of Onset  . Cancer Father     prostate    No Known Allergies  Medication list has been reviewed and updated.  Current Outpatient Prescriptions on File Prior to Visit  Medication Sig Dispense Refill  . acetaminophen (TYLENOL) 500 MG tablet Take 500 mg by mouth every 6 (six) hours as needed for moderate pain or headache.    . meloxicam (MOBIC) 15 MG tablet Take 15 mg by mouth  daily as needed for pain.   1  . HYDROcodone-acetaminophen (NORCO/VICODIN) 5-325 MG tablet Take 1-2 tablets by mouth every 4 (four) hours as needed for moderate pain. (Patient not taking: Reported on 01/12/2016) 30 tablet 0   No current facility-administered medications on file prior to visit.    ROS   Physical Examination: BP 100/68 mmHg  Pulse 59  Temp(Src) 98.3 F (36.8 C) (Oral)  Ht 5\' 8"  (1.727 m)  Wt 152 lb (68.947 kg)  BMI 23.12 kg/m2  SpO2 100% Ideal Body Weight: Weight in (lb) to have BMI = 25: 164.1  Physical Exam  Constitutional: He is oriented to person, place, and time. He appears well-developed and well-nourished. No distress.  HENT:  Head: Normocephalic and atraumatic.  Eyes: Conjunctivae and EOM are normal. Pupils are equal, round, and reactive to light.  Cardiovascular: Normal rate.   Pulmonary/Chest: Effort normal. No respiratory distress.  Neurological: He is alert and oriented to person, place, and time.  Skin: Skin is warm and dry. He is not diaphoretic.  Bullae at the left medial malleolus.  Scant vesicles drainage just distal to this.  Right and left arm with small vesicles without erythematous base.  Clear fluid throughout these findings.  One scant papules at the right side of anterior cervical.  Psychiatric: He has a normal mood and affect. His behavior is normal.     Assessment and Plan: Jay Osborn is a 55 y.o. male who is here today for cc of skin rash. Contact dermatitis, viral Prescribing steroid topical at this time.  Vistaril will hopefully calm itching.  No concern of drug rash at this time.  Advised alarming symptoms to warrant immediate return.  rtc in 7 days if no improvement.  Bullae - Plan: triamcinolone ointment (KENALOG) 0.5 %, hydrOXYzine (ATARAX/VISTARIL) 25 MG tablet  Rash and nonspecific skin eruption - Plan: triamcinolone ointment (KENALOG) 0.5 %, hydrOXYzine (ATARAX/VISTARIL) 25 MG tablet  Pruritic rash - Plan: hydrOXYzine  (ATARAX/VISTARIL) 25 MG tablet  Ivar Drape, PA-C [facility] 6/23/201710:07 AM   There are no diagnoses linked to this encounter.  Ivar Drape, PA-C [facility] 01/12/2016 8:43 AM

## 2016-01-12 NOTE — Patient Instructions (Addendum)
     IF you received an x-ray today, you will receive an invoice from [facility] Radiology. Please contact [facility] Radiology at [phone] with questions or concerns regarding your invoice.   IF you received labwork today, you will receive an invoice from [employer]. Please contact [facility] at [phone] with questions or concerns regarding your invoice.   Our billing staff will not be able to assist you with questions regarding bills from these companies.  You will be contacted with the lab results as soon as they are available. The fastest way to get your results is to activate your My Chart account. Instructions are located on the last page of this paperwork. If you have not heard from [place] regarding the results in 2 weeks, please contact this office.    Possible contact to an allergy.  Please use topical treatment and take medication as prescribed.  I would like you to return in 1 week if your symptoms are not getting better. Rash A rash is a change in the color or texture of the skin. There are many different types of rashes. You may have other problems that accompany your rash. CAUSES   Infections.  Allergic reactions. This can include allergies to pets or foods.  Certain medicines.  Exposure to certain chemicals, soaps, or cosmetics.  Heat.  Exposure to poisonous plants.  Tumors, both cancerous and noncancerous. SYMPTOMS   Redness.  Scaly skin.  Itchy skin.  Dry or cracked skin.  Bumps.  Blisters.  Pain. DIAGNOSIS  Your caregiver may do a physical exam to determine what type of rash you have. A skin sample (biopsy) may be taken and examined under a microscope. TREATMENT  Treatment depends on the type of rash you have. Your caregiver may prescribe certain medicines. For serious conditions, you may need to see a skin doctor (dermatologist). HOME CARE INSTRUCTIONS   Avoid the substance that caused your rash.  Do not scratch  your rash. This can cause infection.  You may take cool baths to help stop itching.  Only take over-the-counter or prescription medicines as directed by your caregiver.  Keep all follow-up appointments as directed by your caregiver. SEEK IMMEDIATE MEDICAL CARE IF:  You have increasing pain, swelling, or redness.  You have a fever.  You have new or severe symptoms.  You have body aches, diarrhea, or vomiting.  Your rash is not better after 3 days. MAKE SURE YOU:  Understand these instructions.  Will watch your condition.  Will get help right away if you are not doing well or get worse.   This information is not intended to replace advice given to you by your health care provider. Make sure you discuss any questions you have with your health care provider.   Document Released: 06/28/2002 Document Revised: 07/29/2014 Document Reviewed: 11/23/2014 Elsevier Interactive Patient Education [employer].

## 2016-01-14 ENCOUNTER — Emergency Department (HOSPITAL_COMMUNITY)
Admission: EM | Admit: 2016-01-14 | Discharge: 2016-01-14 | Disposition: A | Discharge status: Home / Self Care | Payer: BLUE CROSS/BLUE SHIELD | Attending: Emergency Medicine | Admitting: Emergency Medicine

## 2016-01-14 ENCOUNTER — Encounter (HOSPITAL_COMMUNITY): Payer: Self-pay | Admitting: Emergency Medicine

## 2016-01-14 DIAGNOSIS — Z87891 Personal history of nicotine dependence: Secondary | ICD-10-CM | POA: Diagnosis not present

## 2016-01-14 DIAGNOSIS — L259 Unspecified contact dermatitis, unspecified cause: Secondary | ICD-10-CM | POA: Diagnosis not present

## 2016-01-14 DIAGNOSIS — Z791 Long term (current) use of non-steroidal anti-inflammatories (NSAID): Secondary | ICD-10-CM | POA: Insufficient documentation

## 2016-01-14 DIAGNOSIS — Y999 Unspecified external cause status: Secondary | ICD-10-CM | POA: Diagnosis not present

## 2016-01-14 DIAGNOSIS — W57XXXA Bitten or stung by nonvenomous insect and other nonvenomous arthropods, initial encounter: Secondary | ICD-10-CM | POA: Insufficient documentation

## 2016-01-14 DIAGNOSIS — M1990 Unspecified osteoarthritis, unspecified site: Secondary | ICD-10-CM | POA: Insufficient documentation

## 2016-01-14 DIAGNOSIS — S80869A Insect bite (nonvenomous), unspecified lower leg, initial encounter: Secondary | ICD-10-CM | POA: Insufficient documentation

## 2016-01-14 DIAGNOSIS — Z79899 Other long term (current) drug therapy: Secondary | ICD-10-CM | POA: Insufficient documentation

## 2016-01-14 DIAGNOSIS — S0086XA Insect bite (nonvenomous) of other part of head, initial encounter: Secondary | ICD-10-CM | POA: Diagnosis not present

## 2016-01-14 DIAGNOSIS — Y929 Unspecified place or not applicable: Secondary | ICD-10-CM | POA: Insufficient documentation

## 2016-01-14 DIAGNOSIS — Y939 Activity, unspecified: Secondary | ICD-10-CM | POA: Diagnosis not present

## 2016-01-14 DIAGNOSIS — S40869A Insect bite (nonvenomous) of unspecified upper arm, initial encounter: Secondary | ICD-10-CM | POA: Diagnosis present

## 2016-01-14 DIAGNOSIS — L309 Dermatitis, unspecified: Secondary | ICD-10-CM

## 2016-01-14 MED ORDER — PREDNISONE 10 MG (21) PO TBPK: 10.0000 mg | ORAL_TABLET | Freq: Every day | ORAL | Status: DC

## 2016-01-14 MED ORDER — DEXAMETHASONE SODIUM PHOSPHATE 10 MG/ML IJ SOLN
10.0000 mg | Freq: Once | INTRAMUSCULAR | Status: AC
  Administered 2016-01-14: 10 mg via INTRAMUSCULAR
  Filled 2016-01-14: qty 1

## 2016-01-14 MED ORDER — DIPHENHYDRAMINE HCL 50 MG/ML IJ SOLN
25.0000 mg | Freq: Once | INTRAMUSCULAR | Status: AC
  Administered 2016-01-14: 25 mg via INTRAMUSCULAR
  Filled 2016-01-14: qty 1

## 2016-01-14 NOTE — ED Notes (Signed)
Patient here with complaints of bites all over body. Reports being seen at primary care doctor, prescribed an ointment. No relief, itching all over.

## 2016-01-14 NOTE — Discharge Instructions (Signed)
Rash A rash is a change in the color or feel of your skin. There are many different types of rashes. You may have other problems along with your rash. HOME CARE  Avoid the thing that caused your rash.  Do not scratch your rash.  You may take cools baths to help stop itching.  Only take medicines as told by your doctor.  Keep all doctor visits as told. GET HELP RIGHT AWAY IF:   Your pain, puffiness (swelling), or redness gets worse.  You have a fever.  You have new or severe problems.  You have body aches, watery poop (diarrhea), or you throw up (vomit).  Your rash is not better after 3 days. MAKE SURE YOU:   Understand these instructions.  Will watch your condition.  Will get help right away if you are not doing well or get worse.   This information is not intended to replace advice given to you by your health care provider. Make sure you discuss any questions you have with your health care provider.   Document Released: 12/25/2007 Document Revised: 09/30/2011 Document Reviewed: 11/23/2014 Elsevier Interactive Patient Education [employer].

## 2016-01-14 NOTE — ED Provider Notes (Signed)
CSN: [id number]     Arrival date & time 01/14/16  [id number] History   First MD Initiated Contact with Patient 01/14/16 1022     Chief Complaint  Patient presents with  . Insect Bite   PT IS HERE WITH A RASH.  PT SAID IT HAS BEEN GOING ON FOR A FEW DAYS.  HE SAW HIS PCP AND WAS GIVEN A RX FOR ATARAX AND ZYRTEC, BUT IT IS NOT IMPROVING.  PT WAS ALSO GIVEN A RX FOR KENALOG OINTMENT, BUT THE PHARMACY DID NOT HAVE IT.  IT IS ITCHING TERRIBLY.    (Consider location/radiation/quality/duration/timing/severity/associated sxs/prior Treatment) The history is provided by the patient.    Past Medical History  Diagnosis Date  . Arthritis    Past Surgical History  Procedure Laterality Date  . Neck surgery    . Laparoscopic appendectomy N/A 10/18/2015    Procedure: APPENDECTOMY LAPAROSCOPIC;  Surgeon: Johnathan Hausen, MD;  Location: [facility];  Service: General;  Laterality: N/A;   Family History  Problem Relation Age of Onset  . Cancer Father     prostate   Social History  Substance Use Topics  . Smoking status: Former Smoker    Quit date: 03/18/2013  . Smokeless tobacco: Former Systems developer    Types: [place] date: 03/18/2013  . Alcohol Use: No    Review of Systems  Skin: Positive for rash.  All other systems reviewed and are negative.     Allergies  Review of patient's allergies indicates no known allergies.  Home Medications   Prior to Admission medications   Medication Sig Start Date End Date Taking? Authorizing Provider  acetaminophen (TYLENOL) 500 MG tablet Take 500 mg by mouth every 6 (six) hours as needed for moderate pain or headache.    Historical Provider, MD  cetirizine (ZYRTEC) 10 MG tablet Take 1 tablet (10 mg total) by mouth daily. 01/12/16 01/26/16  Joretta Bachelor, PA  HYDROcodone-acetaminophen (NORCO/VICODIN) 5-325 MG tablet Take 1-2 tablets by mouth every 4 (four) hours as needed for moderate pain. Patient not taking: Reported on 01/12/2016 10/19/15   Erby Pian, NP   hydrOXYzine (ATARAX/VISTARIL) 25 MG tablet Take 0.5-1 tablets (12.5-25 mg total) by mouth every 8 (eight) hours as needed for itching. 01/12/16   Dorian Heckle English, PA  meloxicam (MOBIC) 15 MG tablet Take 15 mg by mouth daily as needed for pain.  08/23/15   Historical Provider, MD  predniSONE (STERAPRED UNI-PAK 21 TAB) 10 MG (21) TBPK tablet Take 1 tablet (10 mg total) by mouth daily. Take 6 tabs by mouth daily  for 2 days, then 5 tabs for 2 days, then 4 tabs for 2 days, then 3 tabs for 2 days, 2 tabs for 2 days, then 1 tab by mouth daily for 2 days 01/14/16   Isla Pence, MD  ranitidine (ZANTAC) 150 MG tablet Take 1 tablet (150 mg total) by mouth 2 (two) times daily. 01/12/16 01/26/16  Joretta Bachelor, PA  triamcinolone ointment (KENALOG) 0.5 % Apply 1 application topically 2 (two) times daily. 01/12/16   Dorian Heckle English, PA   BP 124/84 mmHg  Pulse 66  Temp(Src) 98.1 F (36.7 C) (Oral)  Resp 16  SpO2 100% Physical Exam  Constitutional: He is oriented to person, place, and time. He appears well-developed and well-nourished.  HENT:  Head: Normocephalic and atraumatic.  Right Ear: External ear normal.  Left Ear: External ear normal.  Nose: Nose normal.  Mouth/Throat: Oropharynx is clear and moist.  Eyes: Conjunctivae and EOM are normal. Pupils are equal, round, and reactive to light.  Neck: Normal range of motion. Neck supple.  Cardiovascular: Normal rate, regular rhythm, normal heart sounds and intact distal pulses.   Pulmonary/Chest: Effort normal and breath sounds normal.  Abdominal: Bowel sounds are normal.  Musculoskeletal: Normal range of motion.  Neurological: He is alert and oriented to person, place, and time.  Skin: Rash noted.  PT HAS A VESICULAR RASH TO ARMS, LEGS, FACE.  THERE IS 1 AREA ON LEFT ANKLE THAT HAS OPENED, BUT THERE IS NO SURROUNDING REDNESS.  RASH LOOKS LIKE A RHUS DERMATITIS.  Nursing note and vitals reviewed.   ED Course  Procedures (including critical  care time) Labs Review Labs Reviewed - No data to display  Imaging Review No results found. I have personally reviewed and evaluated these images and lab results as part of my medical decision-making.   EKG Interpretation None      MDM  PT IS GIVEN A SHOT OF 10 MG DECADRON AND BENADRYL HERE.  PT IS GIVEN A RX FOR PREDNISONE AND TOLD TO CONTINUE CURRENT MEDS GIVEN TO HIM BY PCP.  PT TOLD TO F/U WITH DERM IF SX WORSEN.  Final diagnoses:  Dermatitis      Isla Pence, MD 01/14/16 1057

## 2016-06-29 ENCOUNTER — Ambulatory Visit (INDEPENDENT_AMBULATORY_CARE_PROVIDER_SITE_OTHER): Payer: BLUE CROSS/BLUE SHIELD | Admitting: Family Medicine

## 2016-06-29 VITALS — BP 100/62 | HR 67 | Temp 98.3°F | Resp 18 | Ht 68.0 in | Wt 154.0 lb

## 2016-06-29 DIAGNOSIS — Z130 Encounter for screening for diseases of the blood and blood-forming organs and certain disorders involving the immune mechanism: Secondary | ICD-10-CM

## 2016-06-29 DIAGNOSIS — Z716 Tobacco abuse counseling: Secondary | ICD-10-CM

## 2016-06-29 DIAGNOSIS — Z1389 Encounter for screening for other disorder: Secondary | ICD-10-CM

## 2016-06-29 DIAGNOSIS — Z1211 Encounter for screening for malignant neoplasm of colon: Secondary | ICD-10-CM | POA: Diagnosis not present

## 2016-06-29 DIAGNOSIS — Z1212 Encounter for screening for malignant neoplasm of rectum: Secondary | ICD-10-CM

## 2016-06-29 DIAGNOSIS — Z1383 Encounter for screening for respiratory disorder NEC: Secondary | ICD-10-CM

## 2016-06-29 DIAGNOSIS — N401 Enlarged prostate with lower urinary tract symptoms: Secondary | ICD-10-CM

## 2016-06-29 DIAGNOSIS — Z23 Encounter for immunization: Secondary | ICD-10-CM

## 2016-06-29 DIAGNOSIS — Z0000 Encounter for general adult medical examination without abnormal findings: Secondary | ICD-10-CM | POA: Diagnosis not present

## 2016-06-29 DIAGNOSIS — Z113 Encounter for screening for infections with a predominantly sexual mode of transmission: Secondary | ICD-10-CM | POA: Diagnosis not present

## 2016-06-29 DIAGNOSIS — Z8042 Family history of malignant neoplasm of prostate: Secondary | ICD-10-CM

## 2016-06-29 DIAGNOSIS — Z136 Encounter for screening for cardiovascular disorders: Secondary | ICD-10-CM | POA: Diagnosis not present

## 2016-06-29 DIAGNOSIS — Z125 Encounter for screening for malignant neoplasm of prostate: Secondary | ICD-10-CM | POA: Diagnosis not present

## 2016-06-29 DIAGNOSIS — Z1329 Encounter for screening for other suspected endocrine disorder: Secondary | ICD-10-CM

## 2016-06-29 DIAGNOSIS — N3943 Post-void dribbling: Secondary | ICD-10-CM

## 2016-06-29 LAB — POCT URINALYSIS DIP (MANUAL ENTRY)
BILIRUBIN UA: NEGATIVE
GLUCOSE UA: NEGATIVE
Ketones, POC UA: NEGATIVE
Leukocytes, UA: NEGATIVE
Nitrite, UA: NEGATIVE
Protein Ur, POC: NEGATIVE
RBC UA: NEGATIVE
SPEC GRAV UA: 1.025
Urobilinogen, UA: 0.2
pH, UA: 5.5

## 2016-06-29 MED ORDER — TAMSULOSIN HCL 0.4 MG PO CAPS: 0.4000 mg | ORAL_CAPSULE | Freq: Every day | ORAL | 3 refills | Status: DC

## 2016-06-29 NOTE — Progress Notes (Signed)
nnnnn

## 2016-06-29 NOTE — Patient Instructions (Addendum)
     IF you received an x-ray today, you will receive an invoice from [facility] Radiology. Please contact [facility] Radiology at [phone] with questions or concerns regarding your invoice.   IF you received labwork today, you will receive an invoice from [employer]. Please contact [facility] at [phone] with questions or concerns regarding your invoice.   Our billing staff will not be able to assist you with questions regarding bills from these companies.  You will be contacted with the lab results as soon as they are available. The fastest way to get your results is to activate your My Chart account. Instructions are located on the last page of this paperwork. If you have not heard from [place] regarding the results in 2 weeks, please contact this office.    Benign Prostatic Hyperplasia An enlarged prostate (benign prostatic hyperplasia) is common in older men. You may experience the following:  Weak urine stream.  Dribbling.  Feeling like the bladder has not emptied completely.  Difficulty starting urination.  Getting up frequently at night to urinate.  Urinating more frequently during the day. HOME CARE INSTRUCTIONS  Monitor your prostatic hyperplasia for any changes. The following actions may help to alleviate any discomfort you are experiencing:  Give yourself time when you urinate.  Stay away from alcohol.  Avoid beverages containing caffeine, such as coffee, tea, and colas, because they can make the problem worse.  Avoid decongestants, antihistamines, and some prescription medicines that can make the problem worse.  Follow up with your health care provider for further treatment as recommended. SEEK MEDICAL CARE IF:  You are experiencing progressive difficulty voiding.  Your urine stream is progressively getting narrower.  You are awaking from sleep with the urge to void more frequently.  You are constantly feeling the need to  void.  You experience loss of urine, especially in small amounts. SEEK IMMEDIATE MEDICAL CARE IF:   You develop increased pain with urination or are unable to urinate.  You develop severe abdominal pain, vomiting, a high fever, or fainting.  You develop back pain or blood in your urine. MAKE SURE YOU:   Understand these instructions.  Will watch your condition.  Will get help right away if you are not doing well or get worse. This information is not intended to replace advice given to you by your health care provider. Make sure you discuss any questions you have with your health care provider. Document Released: 07/08/2005 Document Revised: 07/29/2014 Document Reviewed: 12/08/2012 Elsevier Interactive Patient Education  2017 [employer].

## 2016-06-29 NOTE — Progress Notes (Signed)
Subjective:  By signing my name below, I, Jay Osborn, attest that this documentation has been prepared under the direction and in the presence of Jay Cheadle, MD Electronically Signed: Ladene Osborn, ED Scribe 06/29/2016 at 10:29 AM.   Patient ID: Jay Osborn, Jay Osborn    DOB: 1960/09/23, 55 y.o.   Jay Osborn  Chief Complaint  Patient presents with  . Annual Exam   HPI HPI Comments: Jay Osborn is a 55 y.o. Jay Osborn who [facility].  Prior CPE was 1 year prior. Sees [facility] annually for eye care exam. Has 4 children, 2 adults. Rare social tobacco use. Father had prostate CA in his 50's that required surgery, chemo and radiation. Routine labs last year were normal. 10 years ASCVD was 5.4 last year with a 10 risk of 50% for smoker but decreases to 2.9 to lifetime risk if not smoking. Tetanus was 2015.   No changes within the past year and no concerns that he whishes to concern at this visit. Pt has not had a colonoscopy done yet but is agreeable to schedule one.   Pt reports a dry cough for the past 3 days. He denies nasal congestion, chest pain and sob.   Pt is exercising regularly. He denies chest pain, becoming winded or heart racing during or after exercising.   Pt denies insomnia and states that he is getting adequate sleep.   Past Medical History:  Diagnosis Date  . Allergy   . Arthritis    Current Outpatient Prescriptions on File Prior to Visit  Medication Sig Dispense Refill  . meloxicam (MOBIC) 15 MG tablet Take 15 mg by mouth daily as needed for pain.   1  . cetirizine (ZYRTEC) 10 MG tablet Take 1 tablet (10 mg total) by mouth daily. 14 tablet 1  . HYDROcodone-acetaminophen (NORCO/VICODIN) 5-325 MG tablet Take 1-2 tablets by mouth every 4 (four) hours as needed for moderate pain. (Patient not taking: Reported on 06/29/2016) 30 tablet 0  . hydrOXYzine (ATARAX/VISTARIL) 25 MG tablet Take 0.5-1 tablets (12.5-25  mg total) by mouth every 8 (eight) hours as needed for itching. (Patient not taking: Reported on 06/29/2016) 30 tablet 0  . ranitidine (ZANTAC) 150 MG tablet Take 1 tablet (150 mg total) by mouth 2 (two) times daily. 28 tablet 0   No current facility-administered medications on file prior to visit.    No Known Allergies   Past Surgical History:  Procedure Laterality Date  . APPENDECTOMY    . LAPAROSCOPIC APPENDECTOMY N/A 10/18/2015   Procedure: APPENDECTOMY LAPAROSCOPIC;  Surgeon: Johnathan Hausen, MD;  Location: [facility];  Service: General;  Laterality: N/A;  . NECK SURGERY    . SPINE SURGERY     Family History  Problem Relation Age of Onset  . Cancer Father     prostate   Social History   Social History  . Marital status: Married    Spouse name: N/A  . Number of children: N/A  . Years of education: N/A   Social History Main Topics  . Smoking status: Former Smoker    Quit date: 03/18/2013  . Smokeless tobacco: Former Systems developer    Types: Chew    Quit date: 03/18/2013  . Alcohol use No  . Drug use: No  . Sexual activity: Yes    Birth control/ protection: None   Other Topics Concern  . None   Social History Narrative  . None   Depression  screen [facility] 2/9 06/29/2016 06/29/2016 01/12/2016 06/29/2015 05/10/2015  Decreased Interest 0 0 0 0 0  Down, Depressed, Hopeless 0 0 0 0 0  PHQ - 2 Score 0 0 0 0 0   Immunization History  Administered Date(s) Administered  . Influenza,inj,Quad PF,36+ Mos 05/10/2015, 06/29/2016  . Tdap 10/25/2013    Review of Systems  HENT: Negative for congestion.   Respiratory: Positive for cough. Negative for shortness of breath.   Cardiovascular: Negative for chest pain and palpitations.  Psychiatric/Behavioral: Negative for sleep disturbance.  All other systems reviewed and are negative.     Objective:   Physical Exam  Constitutional: He is oriented to person, place, and time. He appears well-developed and well-nourished. No distress.  HENT:  Head:  Normocephalic and atraumatic.  Right Ear: A middle ear effusion is present.  Left Ear: A middle ear effusion is present.  Nose: Nose normal.  Eyes: Conjunctivae and EOM are normal.  Neck: Neck supple. No tracheal deviation present. No thyromegaly present.  Cardiovascular: Normal rate, regular rhythm, S1 normal, S2 normal and normal heart sounds.   Pulmonary/Chest: Effort normal and breath sounds normal. No respiratory distress.  Good air movement  Musculoskeletal: Normal range of motion.  Lymphadenopathy:    He has no cervical adenopathy.  Neurological: He is alert and oriented to person, place, and time.  Skin: Skin is warm and dry.  Psychiatric: He has a normal mood and affect. His behavior is normal.  Nursing note and vitals reviewed.  BP 100/62 (BP Location: Right Arm, Patient Position: Sitting, Cuff Size: Small)   Pulse 67   Temp 98.3 F (36.8 C) (Oral)   Resp 18   Ht 5\' 8"  (1.727 m)   Wt 154 lb (69.9 kg)   SpO2 100%   BMI 23.42 kg/m     Assessment & Plan:   1. Annual physical exam   2. Screening for cardiovascular, respiratory, and genitourinary diseases   3. Screening for colorectal cancer   4. Screening for deficiency anemia   5. Screening for prostate cancer   6. Screening for thyroid disorder   7. Routine screening for STI (sexually transmitted infection)   8. Tobacco abuse counseling   9. Need for prophylactic vaccination and inoculation against influenza   10. Benign prostatic hyperplasia with post-void dribbling   11. Family history of prostate cancer in father     Orders Placed This Encounter  Procedures  . Flu Vaccine QUAD 36+ mos IM  . CBC  . Comprehensive metabolic panel    Order Specific Question:   Has the patient fasted?    Answer:   Yes  . TSH  . PSA  . Lipid panel    Order Specific Question:   Has the patient fasted?    Answer:   Yes  . HIV antibody  . HCV Ab w/Rflx to Verification  . Interpretation:  . Care order/instruction:    AVS -  please print after vaccine is given and pt can go  . POCT urinalysis dipstick    Meds ordered this encounter  Medications  . guaiFENesin (MUCINEX) 600 MG 12 hr tablet    Sig: Take by mouth 2 (two) times daily.  Marland Kitchen DISCONTD: tamsulosin (FLOMAX) 0.4 MG CAPS capsule    Sig: Take 1 capsule (0.4 mg total) by mouth daily.    Dispense:  90 capsule    Refill:  3    I personally performed the services described in this documentation, which was scribed in my  presence. The recorded information has been reviewed and considered, and addended by me as needed.   Jay Osborn, M.D.  [facility] [address] [place], [place] [address] [phone] phone [phone] fax  07/30/16 10:42 PM

## 2016-07-01 LAB — CBC
HEMATOCRIT: 42.7 % (ref 37.5–51.0)
Hemoglobin: 14.4 g/dL (ref 13.0–17.7)
MCH: 28.6 pg (ref 26.6–33.0)
MCHC: 33.7 g/dL (ref 31.5–35.7)
MCV: 85 fL (ref 79–97)
Platelets: 233 10*3/uL (ref 150–379)
RBC: 5.03 x10E6/uL (ref 4.14–5.80)
RDW: 12.8 % (ref 12.3–15.4)
WBC: 3.7 10*3/uL (ref 3.4–10.8)

## 2016-07-01 LAB — LIPID PANEL
CHOLESTEROL TOTAL: 159 mg/dL (ref 100–199)
Chol/HDL Ratio: 3.5 ratio units (ref 0.0–5.0)
HDL: 46 mg/dL (ref >39)
LDL Calculated: 97 mg/dL (ref 0–99)
TRIGLYCERIDES: 81 mg/dL (ref 0–149)
VLDL CHOLESTEROL CAL: 16 mg/dL (ref 5–40)

## 2016-07-01 LAB — HCV INTERPRETATION

## 2016-07-01 LAB — HIV ANTIBODY (ROUTINE TESTING W REFLEX): HIV SCREEN 4TH GENERATION: NONREACTIVE

## 2016-07-01 LAB — COMPREHENSIVE METABOLIC PANEL
ALK PHOS: 67 IU/L (ref 39–117)
ALT: 11 IU/L (ref 0–44)
AST: 17 IU/L (ref 0–40)
Albumin/Globulin Ratio: 1.4 (ref 1.2–2.2)
Albumin: 4.1 g/dL (ref 3.5–5.5)
BUN/Creatinine Ratio: 10 (ref 9–20)
BUN: 9 mg/dL (ref 6–24)
Bilirubin Total: 0.4 mg/dL (ref 0.0–1.2)
CALCIUM: 9.3 mg/dL (ref 8.7–10.2)
CO2: 26 mmol/L (ref 18–29)
CREATININE: 0.94 mg/dL (ref 0.76–1.27)
Chloride: 102 mmol/L (ref 96–106)
GFR calc Af Amer: 105 mL/min/{1.73_m2} (ref >59)
GFR, EST NON AFRICAN AMERICAN: 91 mL/min/{1.73_m2} (ref >59)
GLOBULIN, TOTAL: 2.9 g/dL (ref 1.5–4.5)
GLUCOSE: 86 mg/dL (ref 65–99)
Potassium: 4.6 mmol/L (ref 3.5–5.2)
Sodium: 140 mmol/L (ref 134–144)
Total Protein: 7 g/dL (ref 6.0–8.5)

## 2016-07-01 LAB — PSA: PROSTATE SPECIFIC AG, SERUM: 1.4 ng/mL (ref 0.0–4.0)

## 2016-07-01 LAB — TSH: TSH: 1.52 u[IU]/mL (ref 0.450–4.500)

## 2016-07-01 LAB — HCV AB W/RFLX TO VERIFICATION

## 2016-07-22 ENCOUNTER — Encounter (HOSPITAL_COMMUNITY): Payer: Self-pay | Admitting: Emergency Medicine

## 2016-07-22 ENCOUNTER — Ambulatory Visit (HOSPITAL_COMMUNITY)
Admission: EM | Admit: 2016-07-22 | Discharge: 2016-07-22 | Disposition: A | Discharge status: Home / Self Care | Payer: BLUE CROSS/BLUE SHIELD | Attending: Internal Medicine | Admitting: Internal Medicine

## 2016-07-22 DIAGNOSIS — J019 Acute sinusitis, unspecified: Secondary | ICD-10-CM

## 2016-07-22 DIAGNOSIS — J209 Acute bronchitis, unspecified: Secondary | ICD-10-CM

## 2016-07-22 DIAGNOSIS — B9689 Other specified bacterial agents as the cause of diseases classified elsewhere: Secondary | ICD-10-CM | POA: Diagnosis not present

## 2016-07-22 MED ORDER — AMOXICILLIN-POT CLAVULANATE 875-125 MG PO TABS: 1.0000 | ORAL_TABLET | Freq: Two times a day (BID) | ORAL | 0 refills | Status: AC

## 2016-07-22 MED ORDER — BENZONATATE 200 MG PO CAPS: 200.0000 mg | ORAL_CAPSULE | Freq: Three times a day (TID) | ORAL | 1 refills | Status: DC | PRN

## 2016-07-22 MED ORDER — PREDNISONE 50 MG PO TABS: 50.0000 mg | ORAL_TABLET | Freq: Every day | ORAL | 0 refills | Status: DC

## 2016-07-22 NOTE — ED Triage Notes (Signed)
The patient presented to the [facility] with a complaint of a cough and congestion x 1 week. 

## 2016-07-22 NOTE — Discharge Instructions (Addendum)
Anticipate gradual improvement in cough, well being, over the next several days.  Recheck for increasing phlegm production/nasal discharge, new fever >100.5, or if not starting to improve in a few days.  Cough may take a couple weeks to subside.

## 2016-07-22 NOTE — ED Provider Notes (Signed)
[place]    CSN: [id number] Arrival date & time: 07/22/16  1248     History   Chief Complaint Chief Complaint  Patient presents with  . Cough    HPI Jay Osborn is a 56 y.o. male. Presents today with more than a week of "itchy" central chest, frequent coughing spells. Runny/congested nose. No headache, no fever. No nausea or vomiting. Has been feeling bad for more than a week. Frequent coughing is really bothering him.   HPI  Past Medical History:  Diagnosis Date  . Allergy   . Arthritis     Patient Active Problem List   Diagnosis Date Noted  . Acute appendicitis 10/18/2015    Past Surgical History:  Procedure Laterality Date  . APPENDECTOMY    . LAPAROSCOPIC APPENDECTOMY N/A 10/18/2015   Procedure: APPENDECTOMY LAPAROSCOPIC;  Surgeon: Johnathan Hausen, MD;  Location: [facility];  Service: General;  Laterality: N/A;  . NECK SURGERY    . SPINE SURGERY         Home Medications    Prior to Admission medications   Medication Sig Start Date End Date Taking? Authorizing Provider  guaiFENesin (MUCINEX) 600 MG 12 hr tablet Take by mouth 2 (two) times daily.   Yes Historical Provider, MD  amoxicillin-clavulanate (AUGMENTIN) 875-125 MG tablet Take 1 tablet by mouth 2 (two) times daily. 07/22/16 07/29/16  Sherlene Shams, MD  benzonatate (TESSALON) 200 MG capsule Take 1 capsule (200 mg total) by mouth 3 (three) times daily as needed for cough. 07/22/16   Sherlene Shams, MD  predniSONE (DELTASONE) 50 MG tablet Take 1 tablet (50 mg total) by mouth daily. 07/22/16   Sherlene Shams, MD    Family History Family History  Problem Relation Age of Onset  . Cancer Father     prostate    Social History Social History  Substance Use Topics  . Smoking status: Former Smoker    Quit date: 03/18/2013  . Smokeless tobacco: Former Systems developer    Types: Chew    Quit date: 03/18/2013  . Alcohol use No     Allergies   Patient has no known allergies.   Review of Systems Review  of Systems  All other systems reviewed and are negative.    Physical Exam Triage Vital Signs ED Triage Vitals  Enc Vitals Group     BP 07/22/16 1312 115/73     Pulse Rate 07/22/16 1312 90     Resp 07/22/16 1312 18     Temp 07/22/16 1312 98.9 F (37.2 C)     Temp Source 07/22/16 1312 Temporal     SpO2 07/22/16 1312 100 %     Weight --      Height --      Pain Score 07/22/16 1315 0   Updated Vital Signs BP 115/73 (BP Location: Left Arm)   Pulse 90   Temp 98.9 F (37.2 C) (Temporal)   Resp 18   SpO2 100%  Physical Exam  Constitutional: He is oriented to person, place, and time. No distress.  Alert, nicely groomed  HENT:  Head: Atraumatic.  Bilateral TMs are dull, no erythema Moderate nasal congestion with mucopurulent material present Throat is red  Eyes:  Conjugate gaze, no eye redness/drainage  Neck: Neck supple.  Cardiovascular: Normal rate and regular rhythm.   Pulmonary/Chest: No respiratory distress.  Coarse but symmetric breath sounds throughout Coughs with deep breath  Abdominal: He exhibits no distension.  Musculoskeletal: Normal range of motion.  Neurological: He is alert and oriented to person, place, and time.  Skin: Skin is warm and dry.  No cyanosis  Nursing note and vitals reviewed.    UC Treatments / Results   Procedures Procedures (including critical care time) None today  Final Clinical Impressions(s) / UC Diagnoses   Final diagnoses:  Acute bacterial sinusitis  Acute bronchitis with bronchospasm   Anticipate gradual improvement in cough, well being, over the next several days.  Recheck for increasing phlegm production/nasal discharge, new fever >100.5, or if not starting to improve in a few days.  Cough may take a couple weeks to subside.  New Prescriptions Discharge Medication List as of 07/22/2016  2:10 PM    START taking these medications   Details  amoxicillin-clavulanate (AUGMENTIN) 875-125 MG tablet Take 1 tablet by mouth 2  (two) times daily., Starting Mon 07/22/2016, Until Mon 07/29/2016, Normal    benzonatate (TESSALON) 200 MG capsule Take 1 capsule (200 mg total) by mouth 3 (three) times daily as needed for cough., Starting Mon 07/22/2016, Normal    predniSONE (DELTASONE) 50 MG tablet Take 1 tablet (50 mg total) by mouth daily., Starting Mon 07/22/2016, Normal         Sherlene Shams, MD 07/23/16 2043

## 2016-10-24 ENCOUNTER — Ambulatory Visit (INDEPENDENT_AMBULATORY_CARE_PROVIDER_SITE_OTHER): Payer: BLUE CROSS/BLUE SHIELD | Admitting: Emergency Medicine

## 2016-10-24 VITALS — BP 99/67 | HR 72 | Temp 98.0°F | Resp 18 | Ht 68.0 in | Wt 151.6 lb

## 2016-10-24 DIAGNOSIS — L299 Pruritus, unspecified: Secondary | ICD-10-CM | POA: Diagnosis not present

## 2016-10-24 DIAGNOSIS — L237 Allergic contact dermatitis due to plants, except food: Secondary | ICD-10-CM | POA: Diagnosis not present

## 2016-10-24 MED ORDER — TRIAMCINOLONE ACETONIDE 0.1 % EX CREA: 1.0000 "application " | TOPICAL_CREAM | Freq: Two times a day (BID) | CUTANEOUS | 0 refills | Status: DC

## 2016-10-24 MED ORDER — PREDNISONE 20 MG PO TABS: 40.0000 mg | ORAL_TABLET | Freq: Every day | ORAL | 0 refills | Status: AC

## 2016-10-24 NOTE — Progress Notes (Signed)
Jay Osborn 56 y.o.   Chief Complaint  Patient presents with  . Rash    rash on right arm and some on back but whole body itches  x3days    HISTORY OF PRESENT ILLNESS: This is a 56 y.o. male complaining of itchy rash x several days.   Rash  This is a new problem. The current episode started in the past 7 days. The problem has been gradually worsening since onset. The affected locations include the right wrist and back. The rash is characterized by blistering, redness and itchiness. He was exposed to plant contact. Pertinent negatives include no congestion, cough, diarrhea, eye pain, facial edema, fever, joint pain, rhinorrhea, shortness of breath, sore throat or vomiting. Past treatments include antihistamine. The treatment provided mild relief. There is no history of asthma.     Prior to Admission medications   Medication Sig Start Date End Date Taking? Authorizing Provider  benzonatate (TESSALON) 200 MG capsule Take 1 capsule (200 mg total) by mouth 3 (three) times daily as needed for cough. Patient not taking: Reported on 10/24/2016 07/22/16   Sherlene Shams, MD  guaiFENesin (MUCINEX) 600 MG 12 hr tablet Take by mouth 2 (two) times daily.    Historical Provider, MD  predniSONE (DELTASONE) 50 MG tablet Take 1 tablet (50 mg total) by mouth daily. Patient not taking: Reported on 10/24/2016 07/22/16   Sherlene Shams, MD    No Known Allergies  Patient Active Problem List   Diagnosis Date Noted  . Acute appendicitis 10/18/2015    Past Medical History:  Diagnosis Date  . Allergy   . Arthritis     Past Surgical History:  Procedure Laterality Date  . APPENDECTOMY    . LAPAROSCOPIC APPENDECTOMY N/A 10/18/2015   Procedure: APPENDECTOMY LAPAROSCOPIC;  Surgeon: Johnathan Hausen, MD;  Location: [facility];  Service: General;  Laterality: N/A;  . NECK SURGERY    . SPINE SURGERY      Social History   Social History  . Marital status: Married    Spouse name: N/A  . Number of children:  N/A  . Years of education: N/A   Occupational History  . Not on file.   Social History Main Topics  . Smoking status: Former Smoker    Quit date: 03/18/2013  . Smokeless tobacco: Former Systems developer    Types: Chew    Quit date: 03/18/2013  . Alcohol use No  . Drug use: No  . Sexual activity: Yes    Birth control/ protection: None   Other Topics Concern  . Not on file   Social History Narrative  . No narrative on file    Family History  Problem Relation Age of Onset  . Cancer Father     prostate     Review of Systems  Constitutional: Negative for diaphoresis, fever and malaise/fatigue.  HENT: Negative for congestion, rhinorrhea and sore throat.   Eyes: Negative for blurred vision, double vision and pain.  Respiratory: Negative for cough, shortness of breath, wheezing and stridor.   Cardiovascular: Negative for chest pain, palpitations and leg swelling.  Gastrointestinal: Negative for abdominal pain, diarrhea, nausea and vomiting.  Genitourinary: Negative.   Musculoskeletal: Negative for joint pain.  Skin: Positive for rash.  Neurological: Negative.  Negative for dizziness and headaches.  Endo/Heme/Allergies: Negative.   All other systems reviewed and are negative.   Vitals:   10/24/16 1129  BP: 99/67  Pulse: 72  Resp: 18  Temp: 98 F (36.7 C)  Physical Exam  Constitutional: He is oriented to person, place, and time. He appears well-developed and well-nourished.  HENT:  Head: Normocephalic and atraumatic.  Nose: Nose normal.  Mouth/Throat: Oropharynx is clear and moist. No oropharyngeal exudate.  Eyes: Conjunctivae and EOM are normal. Pupils are equal, round, and reactive to light.  Neck: Normal range of motion. Neck supple. No JVD present. No thyromegaly present.  Cardiovascular: Normal rate, regular rhythm, normal heart sounds and intact distal pulses.   Pulmonary/Chest: Effort normal and breath sounds normal.  Abdominal: Soft. There is no tenderness.    Musculoskeletal: Normal range of motion.  Lymphadenopathy:    He has no cervical adenopathy.  Neurological: He is alert and oriented to person, place, and time. No sensory deficit. He exhibits normal muscle tone.  Skin: Skin is warm and dry. Rash noted.  Right wrist area: blisterous rash with erythema Some milder rash to back as well.  Vitals reviewed.    ASSESSMENT & PLAN: Jay Osborn was seen today for rash.  Diagnoses and all orders for this visit:  Poison ivy dermatitis  Itching  Other orders -     predniSONE (DELTASONE) 20 MG tablet; Take 2 tablets (40 mg total) by mouth daily with breakfast. -     triamcinolone cream (KENALOG) 0.1 %; Apply 1 application topically 2 (two) times daily.     Patient Instructions       IF you received an x-ray today, you will receive an invoice from [facility]. Please contact [facility] at [phone] with questions or concerns regarding your invoice.   IF you received labwork today, you will receive an invoice from [facility]. Please contact [facility] at [phone] with questions or concerns regarding your invoice.   Our billing staff will not be able to assist you with questions regarding bills from these companies.  You will be contacted with the lab results as soon as they are available. The fastest way to get your results is to activate your My Chart account. Instructions are located on the last page of this paperwork. If you have not heard from [place] regarding the results in 2 weeks, please contact this office.      Poison Ivy Dermatitis Poison ivy dermatitis is redness and soreness (inflammation) of the skin. It is caused by a chemical that is found on the leaves of the poison ivy plant. You may also have itching, a rash, and blisters. Symptoms often clear up in 1-2 weeks. You may get this condition by touching a poison ivy plant. You can also get it by touching something that has the chemical on it. This may  include animals or objects that have come in contact with the plant. Follow these instructions at home: General instructions   Take or apply over-the-counter and prescription medicines only as told by your doctor.  If you touch poison ivy, wash your skin with soap and cold water right away.  Use hydrocortisone creams or calamine lotion as needed to help with itching.  Take oatmeal baths as needed. Use colloidal oatmeal. You can get this at a pharmacy or grocery store. Follow the instructions on the package.  Do not scratch or rub your skin.  While you have the rash, wash your clothes right after you wear them. Prevention   Know what poison ivy looks like so you can avoid it. This plant has three leaves with flowering branches on a single stem. The leaves are glossy. They have uneven edges that come to a point at the  front.  If you have touched poison ivy, wash with soap and water right away. Be sure to wash under your fingernails.  When hiking or camping, wear long pants, a long-sleeved shirt, tall socks, and hiking boots. You can also use a lotion on your skin that helps to prevent contact with the chemical on the plant.  If you think that your clothes or outdoor gear came in contact with poison ivy, rinse them off with a garden hose before you bring them inside your house. Contact a doctor if:  You have open sores in the rash area.  You have more redness, swelling, or pain in the affected area.  You have redness that spreads beyond the rash area.  You have fluid, blood, or pus coming from the affected area.  You have a fever.  You have a rash over a large area of your body.  You have a rash on your eyes, mouth, or genitals.  Your rash does not get better after a few days. Get help right away if:  Your face swells or your eyes swell shut.  You have trouble breathing.  You have trouble swallowing. This information is not intended to replace advice given to you by your  health care provider. Make sure you discuss any questions you have with your health care provider. Document Released: 08/10/2010 Document Revised: 12/14/2015 Document Reviewed: 12/14/2014 Elsevier Interactive Patient Education  2017 Elsevier Inc.      Agustina Caroli, MD [facility]

## 2016-10-24 NOTE — Patient Instructions (Addendum)
IF you received an x-ray today, you will receive an invoice from [facility]. Please contact [facility] at [phone] with questions or concerns regarding your invoice.   IF you received labwork today, you will receive an invoice from [facility]. Please contact [facility] at [phone] with questions or concerns regarding your invoice.   Our billing staff will not be able to assist you with questions regarding bills from these companies.  You will be contacted with the lab results as soon as they are available. The fastest way to get your results is to activate your My Chart account. Instructions are located on the last page of this paperwork. If you have not heard from [place] regarding the results in 2 weeks, please contact this office.      Poison Ivy Dermatitis Poison ivy dermatitis is redness and soreness (inflammation) of the skin. It is caused by a chemical that is found on the leaves of the poison ivy plant. You may also have itching, a rash, and blisters. Symptoms often clear up in 1-2 weeks. You may get this condition by touching a poison ivy plant. You can also get it by touching something that has the chemical on it. This may include animals or objects that have come in contact with the plant. Follow these instructions at home: General instructions   Take or apply over-the-counter and prescription medicines only as told by your doctor.  If you touch poison ivy, wash your skin with soap and cold water right away.  Use hydrocortisone creams or calamine lotion as needed to help with itching.  Take oatmeal baths as needed. Use colloidal oatmeal. You can get this at a pharmacy or grocery store. Follow the instructions on the package.  Do not scratch or rub your skin.  While you have the rash, wash your clothes right after you wear them. Prevention   Know what poison ivy looks like so you can avoid it. This plant has three leaves with flowering branches on a  single stem. The leaves are glossy. They have uneven edges that come to a point at the front.  If you have touched poison ivy, wash with soap and water right away. Be sure to wash under your fingernails.  When hiking or camping, wear long pants, a long-sleeved shirt, tall socks, and hiking boots. You can also use a lotion on your skin that helps to prevent contact with the chemical on the plant.  If you think that your clothes or outdoor gear came in contact with poison ivy, rinse them off with a garden hose before you bring them inside your house. Contact a doctor if:  You have open sores in the rash area.  You have more redness, swelling, or pain in the affected area.  You have redness that spreads beyond the rash area.  You have fluid, blood, or pus coming from the affected area.  You have a fever.  You have a rash over a large area of your body.  You have a rash on your eyes, mouth, or genitals.  Your rash does not get better after a few days. Get help right away if:  Your face swells or your eyes swell shut.  You have trouble breathing.  You have trouble swallowing. This information is not intended to replace advice given to you by your health care provider. Make sure you discuss any questions you have with your health care provider. Document Released: 08/10/2010 Document Revised: 12/14/2015 Document Reviewed: 12/14/2014 Elsevier Interactive  Patient Education  2017 Elsevier Inc.  

## 2016-11-27 ENCOUNTER — Ambulatory Visit (INDEPENDENT_AMBULATORY_CARE_PROVIDER_SITE_OTHER): Payer: BLUE CROSS/BLUE SHIELD

## 2016-11-27 ENCOUNTER — Encounter: Payer: Self-pay | Admitting: Urgent Care

## 2016-11-27 ENCOUNTER — Ambulatory Visit (INDEPENDENT_AMBULATORY_CARE_PROVIDER_SITE_OTHER): Payer: BLUE CROSS/BLUE SHIELD | Admitting: Urgent Care

## 2016-11-27 VITALS — BP 113/74 | HR 64 | Temp 98.3°F | Resp 17 | Ht 68.0 in | Wt 153.0 lb

## 2016-11-27 DIAGNOSIS — R0789 Other chest pain: Secondary | ICD-10-CM | POA: Diagnosis not present

## 2016-11-27 DIAGNOSIS — M940 Chondrocostal junction syndrome [Tietze]: Secondary | ICD-10-CM | POA: Diagnosis not present

## 2016-11-27 MED ORDER — CYCLOBENZAPRINE HCL 5 MG PO TABS: 5.0000 mg | ORAL_TABLET | Freq: Three times a day (TID) | ORAL | 1 refills | Status: DC | PRN

## 2016-11-27 NOTE — Progress Notes (Signed)
  MRN: [id number] DOB: 14-Dec-1960  Subjective:   Jay Osborn is a 56 y.o. male presenting for chief complaint of Chest Pain (Left pectoral area tender to touch/ no shortness of breath or sweating/ onset 2 weeks)  Reports 2 week history of persistent, intermittent, dull and achy left-sided superficial chest pain. Pain is mild-moderate, worse with touch or certain movements of his chest, can have associated burning sensation. Denies fever, diaphoresis, radiation of his pain to his neck, jaw or limb. Denies n/v, abdominal pain, heart racing, palpitations, trauma, falls, fever, weight loss. Denies smoking cigarettes. Patient practices a healthy diet. 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 Denies family history of heart disease, MI, diabetes.   Jay Osborn is not currently taking any medications. Also has No Known Allergies. Jay Osborn  has a past medical history of Allergy and Arthritis. Also  has a past surgical history that includes Neck surgery; laparoscopic appendectomy (N/A, 10/18/2015); Appendectomy; and Spine surgery.  Objective:   Vitals: BP 113/74 (BP Location: Left Arm, Patient Position: Sitting, Cuff Size: Normal)   Pulse 64   Temp 98.3 F (36.8 C) (Oral)   Resp 17   Ht 5\' 8"  (1.727 m)   Wt 153 lb (69.4 kg)   SpO2 100%   BMI 23.26 kg/m   Physical Exam  Constitutional: He is oriented to person, place, and time. He appears well-developed and well-nourished.  HENT:  Mouth/Throat: Oropharynx is clear and moist.  Eyes: No scleral icterus.  Neck: No JVD present.  Cardiovascular: Normal rate, regular rhythm and intact distal pulses.  Exam reveals no gallop and no friction rub.   No murmur heard. Pulmonary/Chest: No respiratory distress. He has no wheezes. He has no rales. Chest wall is not dull to percussion. He exhibits tenderness (over area depicted). He exhibits no crepitus, no deformity, no swelling and no retraction. Left breast exhibits no  mass, no nipple discharge and no skin change.    Abdominal: Soft. Bowel sounds are normal. He exhibits no distension and no mass. There is no tenderness. There is no guarding.  Neurological: He is alert and oriented to person, place, and time.  Skin: Skin is warm and dry. Capillary refill takes less than 2 seconds. No rash noted.  Psychiatric: He has a normal mood and affect.   ECG interpretation - Sinus rhythm at 64 bpm.   Dg Chest 2 View  Result Date: 11/27/2016 CLINICAL DATA:  Atypical chest pain EXAM: CHEST  2 VIEW COMPARISON:  June 01, 2013 FINDINGS: Lungs are clear. Heart size and pulmonary vascularity are normal. No adenopathy. There is aortic atherosclerosis. No evident bone lesions. IMPRESSION: Aortic atherosclerosis.  No edema or consolidation. Electronically Signed   By: Lowella Grip III M.D.   On: 11/27/2016 08:52   Assessment and Plan :   1. Costochondritis 2. Atypical chest pain - Will manage conservatively given minimal risk factors. Start APAP with ibuprofen and Flexeril. Labs pending, recheck in 1-2 weeks, plan is to perform complete physical then including counseling on heart disease/atherosclerosis. Return-to-clinic precautions discussed, patient verbalized understanding.   [name], PA-C [facility] [phone] 11/27/2016  8:36 AM

## 2016-11-27 NOTE — Patient Instructions (Addendum)
You may take 500mg  Tylenol with ibuprofen 400-600mg  every 6 hours for pain and inflammation of the chest. Make sure you rest and don't lift a lot of heavy items or perform strenuous exercise.    Costochondritis Costochondritis is swelling and irritation (inflammation) of the tissue (cartilage) that connects your ribs to your breastbone (sternum). This causes pain in the front of your chest. The pain usually starts gradually and involves more than one rib. What are the causes? The exact cause of this condition is not always known. It results from stress on the cartilage where your ribs attach to your sternum. The cause of this stress could be:  Chest injury (trauma).  Exercise or activity, such as lifting.  Severe coughing. What increases the risk? You may be at higher risk for this condition if you:  Are male.  Are 30?56 years old.  Recently started a new exercise or work activity.  Have low levels of vitamin D.  Have a condition that makes you cough frequently. What are the signs or symptoms? The main symptom of this condition is chest pain. The pain:  Usually starts gradually and can be sharp or dull.  Gets worse with deep breathing, coughing, or exercise.  Gets better with rest.  May be worse when you press on the sternum-rib connection (tenderness). How is this diagnosed? This condition is diagnosed based on your symptoms, medical history, and a physical exam. Your health care provider will check for tenderness when pressing on your sternum. This is the most important finding. You may also have tests to rule out other causes of chest pain. These may include:  A chest X-ray to check for lung problems.  An electrocardiogram (ECG) to see if you have a heart problem that could be causing the pain.  An imaging scan to rule out a chest or rib fracture. How is this treated? This condition usually goes away on its own over time. Your health care provider may prescribe an  NSAID to reduce pain and inflammation. Your health care provider may also suggest that you:  Rest and avoid activities that make pain worse.  Apply heat or cold to the area to reduce pain and inflammation.  Do exercises to stretch your chest muscles. If these treatments do not help, your health care provider may inject a numbing medicine at the sternum-rib connection to help relieve the pain. Follow these instructions at home:  Avoid activities that make pain worse. This includes any activities that use chest, abdominal, and side muscles.  If directed, put ice on the painful area:  Put ice in a plastic bag.  Place a towel between your skin and the bag.  Leave the ice on for 20 minutes, 2-3 times a day.  If directed, apply heat to the affected area as often as told by your health care provider. Use the heat source that your health care provider recommends, such as a moist heat pack or a heating pad.  Place a towel between your skin and the heat source.  Leave the heat on for 20-30 minutes.  Remove the heat if your skin turns bright red. This is especially important if you are unable to feel pain, heat, or cold. You may have a greater risk of getting burned.  Take over-the-counter and prescription medicines only as told by your health care provider.  Return to your normal activities as told by your health care provider. Ask your health care provider what activities are safe for you.  Keep  all follow-up visits as told by your health care provider. This is important. Contact a health care provider if:  You have chills or a fever.  Your pain does not go away or it gets worse.  You have a cough that does not go away (is persistent). Get help right away if:  You have shortness of breath. This information is not intended to replace advice given to you by your health care provider. Make sure you discuss any questions you have with your health care provider. Document Released:  04/17/2005 Document Revised: 01/26/2016 Document Reviewed: 11/01/2015 Elsevier Interactive Patient Education  2017 Elsevier Inc.    Chest Wall Pain Chest wall pain is pain in or around the bones and muscles of your chest. Sometimes, an injury causes this pain. Sometimes, the cause may not be known. This pain may take several weeks or longer to get better. Follow these instructions at home: Pay attention to any changes in your symptoms. Take these actions to help with your pain:  Rest as told by your health care provider.  Avoid activities that cause pain. These include any activities that use your chest muscles or your abdominal and side muscles to lift heavy items.  If directed, apply ice to the painful area:  Put ice in a plastic bag.  Place a towel between your skin and the bag.  Leave the ice on for 20 minutes, 2-3 times per day.  Take over-the-counter and prescription medicines only as told by your health care provider.  Do not use tobacco products, including cigarettes, chewing tobacco, and e-cigarettes. If you need help quitting, ask your health care provider.  Keep all follow-up visits as told by your health care provider. This is important. Contact a health care provider if:  You have a fever.  Your chest pain becomes worse.  You have new symptoms. Get help right away if:  You have nausea or vomiting.  You feel sweaty or light-headed.  You have a cough with phlegm (sputum) or you cough up blood.  You develop shortness of breath. This information is not intended to replace advice given to you by your health care provider. Make sure you discuss any questions you have with your health care provider. Document Released: 07/08/2005 Document Revised: 11/16/2015 Document Reviewed: 10/03/2014 Elsevier Interactive Patient Education  2017 [employer].     IF you received an x-ray today, you will receive an invoice from [facility]. Please contact [facility]  Radiology at [phone] with questions or concerns regarding your invoice.   IF you received labwork today, you will receive an invoice from [facility]. Please contact [facility] at [phone] with questions or concerns regarding your invoice.   Our billing staff will not be able to assist you with questions regarding bills from these companies.  You will be contacted with the lab results as soon as they are available. The fastest way to get your results is to activate your My Chart account. Instructions are located on the last page of this paperwork. If you have not heard from [place] regarding the results in 2 weeks, please contact this office.

## 2016-11-28 LAB — BASIC METABOLIC PANEL
BUN/Creatinine Ratio: 15 (ref 9–20)
BUN: 12 mg/dL (ref 6–24)
CO2: 26 mmol/L (ref 18–29)
Calcium: 9.5 mg/dL (ref 8.7–10.2)
Chloride: 101 mmol/L (ref 96–106)
Creatinine, Ser: 0.78 mg/dL (ref 0.76–1.27)
GFR calc Af Amer: 117 mL/min/{1.73_m2} (ref >59)
GFR calc non Af Amer: 102 mL/min/{1.73_m2} (ref >59)
GLUCOSE: 97 mg/dL (ref 65–99)
POTASSIUM: 4.9 mmol/L (ref 3.5–5.2)
SODIUM: 142 mmol/L (ref 134–144)

## 2016-11-28 LAB — CBC
Hematocrit: 42.7 % (ref 37.5–51.0)
Hemoglobin: 13.8 g/dL (ref 13.0–17.7)
MCH: 28.1 pg (ref 26.6–33.0)
MCHC: 32.3 g/dL (ref 31.5–35.7)
MCV: 87 fL (ref 79–97)
Platelets: 264 10*3/uL (ref 150–379)
RBC: 4.91 x10E6/uL (ref 4.14–5.80)
RDW: 12.9 % (ref 12.3–15.4)
WBC: 3.9 10*3/uL (ref 3.4–10.8)

## 2016-11-28 LAB — SEDIMENTATION RATE: SED RATE: 2 mm/h (ref 0–30)

## 2016-11-28 LAB — CK: CK TOTAL: 127 U/L (ref 24–204)

## 2017-01-03 ENCOUNTER — Emergency Department (HOSPITAL_COMMUNITY)
Admission: EM | Admit: 2017-01-03 | Discharge: 2017-01-03 | Disposition: A | Discharge status: Home / Self Care | Payer: BLUE CROSS/BLUE SHIELD | Attending: Emergency Medicine | Admitting: Emergency Medicine

## 2017-01-03 ENCOUNTER — Encounter (HOSPITAL_COMMUNITY): Payer: Self-pay | Admitting: *Deleted

## 2017-01-03 ENCOUNTER — Emergency Department (HOSPITAL_COMMUNITY): Payer: BLUE CROSS/BLUE SHIELD

## 2017-01-03 DIAGNOSIS — R5383 Other fatigue: Secondary | ICD-10-CM | POA: Diagnosis not present

## 2017-01-03 DIAGNOSIS — R200 Anesthesia of skin: Secondary | ICD-10-CM | POA: Insufficient documentation

## 2017-01-03 DIAGNOSIS — R0789 Other chest pain: Secondary | ICD-10-CM | POA: Diagnosis present

## 2017-01-03 DIAGNOSIS — R002 Palpitations: Secondary | ICD-10-CM

## 2017-01-03 DIAGNOSIS — Z87891 Personal history of nicotine dependence: Secondary | ICD-10-CM | POA: Insufficient documentation

## 2017-01-03 LAB — I-STAT TROPONIN, ED: TROPONIN I, POC: 0 ng/mL (ref 0.00–0.08)

## 2017-01-03 LAB — CBC
HEMATOCRIT: 43.4 % (ref 39.0–52.0)
HEMOGLOBIN: 14.1 g/dL (ref 13.0–17.0)
MCH: 28 pg (ref 26.0–34.0)
MCHC: 32.5 g/dL (ref 30.0–36.0)
MCV: 86.1 fL (ref 78.0–100.0)
Platelets: 233 10*3/uL (ref 150–400)
RBC: 5.04 MIL/uL (ref 4.22–5.81)
RDW: 12.6 % (ref 11.5–15.5)
WBC: 3.5 10*3/uL — AB (ref 4.0–10.5)

## 2017-01-03 LAB — BASIC METABOLIC PANEL
ANION GAP: 10 (ref 5–15)
BUN: 12 mg/dL (ref 6–20)
CHLORIDE: 102 mmol/L (ref 101–111)
CO2: 26 mmol/L (ref 22–32)
Calcium: 9 mg/dL (ref 8.9–10.3)
Creatinine, Ser: 0.95 mg/dL (ref 0.61–1.24)
GFR calc Af Amer: >60 mL/min (ref >60)
GLUCOSE: 108 mg/dL — AB (ref 65–99)
POTASSIUM: 4 mmol/L (ref 3.5–5.1)
Sodium: 138 mmol/L (ref 135–145)

## 2017-01-03 LAB — TROPONIN I: Troponin I: <0.03 ng/mL (ref <0.03)

## 2017-01-03 NOTE — ED Provider Notes (Signed)
[facility] DEPT Provider Note   CSN: [id number] Arrival date & time: 01/03/17  0231  By signing my name below, I, [name], attest that this documentation has been prepared under the direction and in the presence of Jay Etienne, DO. Electronically Signed: [name], ED Scribe. 01/03/17. 3:19 AM.  History   Chief Complaint Chief Complaint  Patient presents with  . Chest Pain   The history is provided by the patient. No language interpreter was used.   HPI Comments: Jay Osborn is a 56 y.o. male who presents to the Emergency Department complaining of intermittent, moderate left-sided chest pain onset this evening. Pt states episodes last for 10 minutes. No modifying factors. Pt reports associated ear pain, heart palpitations, numbness in bilateral arms, diaphoresis and fatigue. Pt denies hx of DM, HTN, HLD, PE/DVT, recent long travel, surgery, fracture, or prolonged immobilization. No other complaints at this time.   Past Medical History:  Diagnosis Date  . Allergy   . Arthritis     Patient Active Problem List   Diagnosis Date Noted  . Poison ivy dermatitis 10/24/2016  . Itching 10/24/2016  . Acute appendicitis 10/18/2015    Past Surgical History:  Procedure Laterality Date  . APPENDECTOMY    . LAPAROSCOPIC APPENDECTOMY N/A 10/18/2015   Procedure: APPENDECTOMY LAPAROSCOPIC;  Surgeon: Johnathan Hausen, MD;  Location: [facility] ORS;  Service: General;  Laterality: N/A;  . NECK SURGERY    . SPINE SURGERY         Home Medications    Prior to Admission medications   Medication Sig Start Date End Date Taking? Authorizing Provider  cyclobenzaprine (FLEXERIL) 5 MG tablet Take 1 tablet (5 mg total) by mouth 3 (three) times daily as needed. Patient not taking: Reported on 01/03/2017 11/27/16   [name], PA-C    Family History Family History  Problem Relation Age of Onset  . Cancer Father        prostate    Social History Social History  Substance Use  Topics  . Smoking status: Former Smoker    Quit date: 03/18/2013  . Smokeless tobacco: Former Systems developer    Types: Chew    Quit date: 03/18/2013  . Alcohol use No     Allergies   Patient has no known allergies.   Review of Systems Review of Systems  Constitutional: Positive for diaphoresis and fatigue. Negative for chills and fever.  HENT: Negative for congestion and facial swelling.   Eyes: Negative for discharge and visual disturbance.  Respiratory: Negative for shortness of breath.   Cardiovascular: Positive for chest pain and palpitations.  Gastrointestinal: Negative for abdominal pain, diarrhea and vomiting.  Musculoskeletal: Negative for arthralgias and myalgias.  Skin: Negative for color change and rash.  Neurological: Positive for numbness. Negative for tremors, syncope and headaches.  Psychiatric/Behavioral: Negative for confusion and dysphoric mood.     Physical Exam Updated Vital Signs BP 112/78   Pulse 62   Temp 98 F (36.7 C)   Resp 17   Ht 5\' 9"  (1.753 m)   Wt 68.9 kg (152 lb)   SpO2 100%   BMI 22.45 kg/m   Physical Exam  Constitutional: He is oriented to person, place, and time. He appears well-developed and well-nourished.  HENT:  Head: Normocephalic and atraumatic.  Eyes: EOM are normal. Pupils are equal, round, and reactive to light.  Neck: Normal range of motion. Neck supple. No JVD present.  Cardiovascular: Normal rate, regular rhythm and normal heart sounds.  Exam reveals no  gallop and no friction rub.   No murmur heard. Equal upper extremity pulses.   Pulmonary/Chest: Effort normal and breath sounds normal. No respiratory distress. He has no wheezes.  Abdominal: Soft. He exhibits no distension. There is no rebound and no guarding.  Musculoskeletal: Normal range of motion.  Neurological: He is alert and oriented to person, place, and time.  Skin: No rash noted. No pallor.  Psychiatric: He has a normal mood and affect. His behavior is normal.    Nursing note and vitals reviewed.    ED Treatments / Results  DIAGNOSTIC STUDIES: Oxygen Saturation is 100% on RA, normal by my interpretation.   COORDINATION OF CARE: 3:15 AM-Discussed next steps with pt including re-checking lab results. Pt verbalized understanding and is agreeable with the plan.   Labs (all labs ordered are listed, but only abnormal results are displayed) Labs Reviewed  BASIC METABOLIC PANEL - Abnormal; Notable for the following:       Result Value   Glucose, Bld 108 (*)    All other components within normal limits  CBC - Abnormal; Notable for the following:    WBC 3.5 (*)    All other components within normal limits  TROPONIN I  I-STAT TROPOININ, ED    EKG  EKG Interpretation  Date/Time:  Friday January 03 2017 02:36:21 EDT Ventricular Rate:  70 PR Interval:  146 QRS Duration: 92 QT Interval:  382 QTC Calculation: 412 R Axis:   74 Text Interpretation:  Normal sinus rhythm Possible Left atrial enlargement Incomplete right bundle branch block Septal infarct , age undetermined Abnormal ECG peaked t waves Confirmed by Jay Osborn [phone]) on 01/03/2017 2:57:06 AM       Radiology Dg Chest 2 View  Result Date: 01/03/2017 CLINICAL DATA:  Tachycardia and arm numbness for 10 minutes. EXAM: CHEST  2 VIEW COMPARISON:  Chest radiograph Nov 27, 2016 FINDINGS: Cardiomediastinal silhouette is normal. No pleural effusions or focal consolidations. Trachea projects midline and there is no pneumothorax. Soft tissue planes and included osseous structures are non-suspicious. ACDF. IMPRESSION: No acute cardiopulmonary process. Electronically Signed   By: Elon Alas M.D.   On: 01/03/2017 03:17    Procedures Procedures (including critical care time)  Medications Ordered in ED Medications - No data to display   Initial Impression / Assessment and Plan / ED Course  I have reviewed the triage vital signs and the nursing notes.  Pertinent labs & imaging results  that were available during my care of the patient were reviewed by me and considered in my medical decision making (see chart for details).     56 yo M with palpations.  Off and on for past couple of hours.  Feels like his heart is beating heavy.  Some pressure to his chest as well. Denies sob, diaphoresis, nausea vomiting.  EKG with mild peaking compared to last. CXR, labs unremarkable.  Delta trop negative. No noted findings on tele.  D/c home. PCP follow up.  6:19 AM:  I have discussed the diagnosis/risks/treatment options with the patient and family and believe the pt to be eligible for discharge home to follow-up with PCP. We also discussed returning to the ED immediately if new or worsening sx occur. We discussed the sx which are most concerning (e.g., sudden worsening pain, fever, inability to tolerate by mouth) that necessitate immediate return. Medications administered to the patient during their visit and any new prescriptions provided to the patient are listed below.  Medications given during this visit  Medications - No data to display   The patient appears reasonably screen and/or stabilized for discharge and I doubt any other medical condition or other [facility] requiring further screening, evaluation, or treatment in the ED at this time prior to discharge.    Final Clinical Impressions(s) / ED Diagnoses   Final diagnoses:  Atypical chest pain  Palpitations    New Prescriptions New Prescriptions   No medications on file   I personally performed the services described in this documentation, which was scribed in my presence. The recorded information has been reviewed and is accurate.      Jay Etienne, DO 01/03/17 [phone]

## 2017-01-03 NOTE — ED Triage Notes (Signed)
The pt is c/o chest pain tachycardia and numbness in both arms for the past 10 minutes.  No pain  At present  No previous history except neck surgery

## 2017-01-03 NOTE — Discharge Instructions (Signed)
Follow up with a primary doctor to further evaluate this issue.

## 2017-01-03 NOTE — ED Notes (Signed)
Patient transported to X-ray 

## 2017-01-07 ENCOUNTER — Ambulatory Visit (INDEPENDENT_AMBULATORY_CARE_PROVIDER_SITE_OTHER): Payer: BLUE CROSS/BLUE SHIELD | Admitting: Urgent Care

## 2017-01-07 ENCOUNTER — Encounter: Payer: Self-pay | Admitting: Urgent Care

## 2017-01-07 VITALS — BP 118/74 | HR 69 | Temp 98.4°F | Resp 16 | Ht 69.0 in | Wt 150.0 lb

## 2017-01-07 DIAGNOSIS — R000 Tachycardia, unspecified: Secondary | ICD-10-CM

## 2017-01-07 DIAGNOSIS — R0789 Other chest pain: Secondary | ICD-10-CM

## 2017-01-07 NOTE — Patient Instructions (Addendum)
Nonspecific Chest Pain Chest pain can be caused by many different conditions. There is always a chance that your pain could be related to something serious, such as a heart attack or a blood clot in your lungs. Chest pain can also be caused by conditions that are not life-threatening. If you have chest pain, it is very important to follow up with your health care provider. What are the causes? Causes of this condition include:  Heartburn.  Pneumonia or bronchitis.  Anxiety or stress.  Inflammation around your heart (pericarditis) or lung (pleuritis or pleurisy).  A blood clot in your lung.  A collapsed lung (pneumothorax). This can develop suddenly on its own (spontaneous pneumothorax) or from trauma to the chest.  Shingles infection (varicella-zoster virus).  Heart attack.  Damage to the bones, muscles, and cartilage that make up your chest wall. This can include: ? Bruised bones due to injury. ? Strained muscles or cartilage due to frequent or repeated coughing or overwork. ? Fracture to one or more ribs. ? Sore cartilage due to inflammation (costochondritis).  What increases the risk? Risk factors for this condition may include:  Activities that increase your risk for trauma or injury to your chest.  Respiratory infections or conditions that cause frequent coughing.  Medical conditions or overeating that can cause heartburn.  Heart disease or family history of heart disease.  Conditions or health behaviors that increase your risk of developing a blood clot.  Having had chicken pox (varicella zoster).  What are the signs or symptoms? Chest pain can feel like:  Burning or tingling on the surface of your chest or deep in your chest.  Crushing, pressure, aching, or squeezing pain.  Dull or sharp pain that is worse when you move, cough, or take a deep breath.  Pain that is also felt in your back, neck, shoulder, or arm, or pain that spreads to any of these  areas.  Your chest pain may come and go, or it may stay constant. How is this diagnosed? Lab tests or other studies may be needed to find the cause of your pain. Your health care provider may have you take a test called an ECG (electrocardiogram). An ECG records your heartbeat patterns at the time the test is performed. You may also have other tests, such as:  Transthoracic echocardiogram (TTE). In this test, sound waves are used to create a picture of the heart structures and to look at how blood flows through your heart.  Transesophageal echocardiogram (TEE).This is a more advanced imaging test that takes images from inside your body. It allows your health care provider to see your heart in finer detail.  Cardiac monitoring. This allows your health care provider to monitor your heart rate and rhythm in real time.  Holter monitor. This is a portable device that records your heartbeat and can help to diagnose abnormal heartbeats. It allows your health care provider to track your heart activity for several days, if needed.  Stress tests. These can be done through exercise or by taking medicine that makes your heart beat more quickly.  Blood tests.  Other imaging tests.  How is this treated? Treatment depends on what is causing your chest pain. Treatment may include:  Medicines. These may include: ? Acid blockers for heartburn. ? Anti-inflammatory medicine. ? Pain medicine for inflammatory conditions. ? Antibiotic medicine, if an infection is present. ? Medicines to dissolve blood clots. ? Medicines to treat coronary artery disease (CAD).  Supportive care for conditions that  do not require medicines. This may include: ? Resting. ? Applying heat or cold packs to injured areas. ? Limiting activities until pain decreases.  Follow these instructions at home: Medicines  If you were prescribed an antibiotic, take it as told by your health care provider. Do not stop taking the  antibiotic even if you start to feel better.  Take over-the-counter and prescription medicines only as told by your health care provider. Lifestyle  Do not use any products that contain nicotine or tobacco, such as cigarettes and e-cigarettes. If you need help quitting, ask your health care provider.  Do not drink alcohol.  Make lifestyle changes as directed by your health care provider. These may include: ? Getting regular exercise. Ask your health care provider to suggest some activities that are safe for you. ? Eating a heart-healthy diet. A registered dietitian can help you to learn healthy eating options. ? Maintaining a healthy weight. ? Managing diabetes, if necessary. ? Reducing stress, such as with yoga or relaxation techniques. General instructions  Avoid any activities that bring on chest pain.  If heartburn is the cause for your chest pain, raise (elevate) the head of your bed about 6 inches (15 cm) by putting blocks under the legs. Sleeping with more pillows does not effectively relieve heartburn because it only changes the position of your head.  Keep all follow-up visits as told by your health care provider. This is important. This includes any further testing if your chest pain does not go away. Contact a health care provider if:  Your chest pain does not go away.  You have a rash with blisters on your chest.  You have a fever.  You have chills. Get help right away if:  Your chest pain is worse.  You have a cough that gets worse, or you cough up blood.  You have severe pain in your abdomen.  You have severe weakness.  You faint.  You have sudden, unexplained chest discomfort.  You have sudden, unexplained discomfort in your arms, back, neck, or jaw.  You have shortness of breath at any time.  You suddenly start to sweat, or your skin gets clammy.  You feel nauseous or you vomit.  You suddenly feel light-headed or dizzy.  Your heart begins to beat  quickly, or it feels like it is skipping beats. These symptoms may represent a serious problem that is an emergency. Do not wait to see if the symptoms will go away. Get medical help right away. Call your local emergency services (911 in the [place] Do not drive yourself to the hospital. This information is not intended to replace advice given to you by your health care provider. Make sure you discuss any questions you have with your health care provider. Document Released: 04/17/2005 Document Revised: 04/01/2016 Document Reviewed: 04/01/2016 Elsevier Interactive Patient Education  2017 [employer] Inc[employer]     IF you received an x-ray today, you will receive an invoice from [facility]. Please contact  Radiology at [phone] with questions or concerns regarding your invoice.   IF you received labwork today, you will receive an invoice from [facility]. Please contact [facility] at [phone] with questions or concerns regarding your invoice.   Our billing staff will not be able to assist you with questions regarding bills from these companies.  You will be contacted with the lab results as soon as they are available. The fastest way to get your results is to activate your My Chart account. Instructions are located   on the last page of this paperwork. If you have not heard from [place] regarding the results in 2 weeks, please contact this office.

## 2017-01-07 NOTE — Progress Notes (Addendum)
   MRN: [id number] DOB: [birth year]  Subjective:   Jay Osborn is a 56 y.o. male presenting for follow up on atypical chest pain. Last OV with [place] was 11/27/2016, had normal ecg, chest x-ray. Patient continued to have chest pain and presented to the ER on 01/03/2017. He had negative work up there as well including negative troponins. Today, he reports that he is no longer having chest pain but is very concerned about a racing heart beat. Feels like his chest is pounding over left lower side, is associated with sweating and fatigue. The racing heart beat occurs randomly, subsides within seconds. Denies radiation of chest pain to back, neck, jaw, limb or abdomen. Denies shob, n/v, dizziness.   Alok has a current medication list which includes the following prescription(s): cyclobenzaprine. Also has No Known Allergies. Sabir  has a past medical history of Allergy and Arthritis. Also  has a past surgical history that includes Neck surgery; laparoscopic appendectomy (N/A, 10/18/2015); Appendectomy; and Spine surgery. His family history includes Cancer in his father.   Objective:   Vitals: BP 118/74   Pulse 69   Temp 98.4 F (36.9 C) (Oral)   Resp 16   Ht 5\' 9"  (1.753 m)   Wt 150 lb (68 kg)   SpO2 95%   BMI 22.15 kg/m   Physical Exam  Constitutional: He is oriented to person, place, and time. He appears well-developed and well-nourished.  HENT:  Mouth/Throat: Oropharynx is clear and moist.  Eyes: No scleral icterus.  Neck: No JVD present.  Cardiovascular: Normal rate, regular rhythm and intact distal pulses.  Exam reveals no gallop and no friction rub.   No murmur heard. Pulmonary/Chest: No respiratory distress. He has no wheezes. He has no rales. He exhibits no tenderness.  Abdominal: Soft. Bowel sounds are normal. He exhibits no distension. There is no tenderness. There is no guarding.  Neurological: He is alert and oriented to person, place, and time.  Skin: Skin is warm.    Psychiatric: He has a normal mood and affect.   ECG interpretation - sinus rhythm at 67bpm.  Assessment and Plan :   1. Atypical chest pain 2. Racing heart beat - Will refer to cardiology for work up of atypical chest pain and racing heart beat.  [name], PA-C [facility] [phone] 01/07/2017 10:31 AM

## 2017-01-17 ENCOUNTER — Ambulatory Visit (INDEPENDENT_AMBULATORY_CARE_PROVIDER_SITE_OTHER): Payer: BLUE CROSS/BLUE SHIELD | Admitting: Cardiology

## 2017-01-17 ENCOUNTER — Encounter: Payer: Self-pay | Admitting: Cardiology

## 2017-01-17 VITALS — BP 107/73 | HR 64 | Ht 69.0 in | Wt 150.0 lb

## 2017-01-17 DIAGNOSIS — I700 Atherosclerosis of aorta: Secondary | ICD-10-CM | POA: Diagnosis not present

## 2017-01-17 DIAGNOSIS — R079 Chest pain, unspecified: Secondary | ICD-10-CM | POA: Diagnosis not present

## 2017-01-17 NOTE — Patient Instructions (Signed)
Schedule at [facility] has requested that you have cardiac CT CAL ICUM SCORING. Cardiac computed tomography (CT) is a painless test that uses an x-ray machine to take clear, detailed pictures of your heart. For further information please visit [email or web address]. Please follow instruction sheet as given.    NO CHANGE WITH MEDICATIONS   Your physician recommends that you schedule a follow-up appointment in [place].

## 2017-01-17 NOTE — Progress Notes (Signed)
PCP: [name], PA-C - [facility]  Clinic Note: Chief Complaint  Patient presents with  . New Evaluation    chest pain , CAD on chest xray    HPI: Jay Osborn is a 56 y.o. male who is being seen today for the evaluation of Chest Pain & Aortic Calcification at the request of [name], PA-C.  Kenzo Gala Murdoch was seen on January 07, 2017 at [facility] by Mr. Bess Harvest for ER f/u --> apparently had some chest pain act in me on May 9 within normal EKG and chest x-ray. He then went to the emergency room on June 15 for similar symptoms. Workup in the emergency room was negative with negative troponins. All of her chest x-ray showed aortic atherosclerosis. On reevaluation at [facility] ER visit, he noted no further chest pain but was concerned about a racing heart during. Is also concerned about the chest x-ray findings.  Recent Hospitalizations: ER visit as noted above. - Intermittent, moderate left-sided chest pain" third shift " (according to the ER note, the symptoms lasted 10 minutes, however according to him it lasted several hours). Ruled out for MI.  Studies Personally Reviewed - (if available, images/films reviewed: From Epic Chart or Care Everywhere)  CXR: no acutee CP process (from 11/27/16 - Aortic Atherosclerosis noted).  Interval History: Samie presents here today for cardiology evaluation based on his chest x-ray findings and recurrent chest pain. He describes to me that he had 2 spells until total. The first one was when he went to the urgent care for the first time. This was not as pronounced, however the second time he went to the emergency room for a more prolonged episode lasting a few hours (however the ER visit note says 10 minutes). He described left-sided chest discomfort while working the third shift at night. 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 He felt his heart rate go up and felt a little bit flushed with it. Low short of breath. He got scared and went to the emergency room. But since that episode he is not had any further episodes.   He really has had no other cardiac symptoms to speak of including PND, orthopnea or edema, 60/near-syncope or TIAs amaurosis fugax. Only "palpitations" he was noted was the short episode of tachycardia associated with chest pain. Other than these 2 episodes of chest pain, he has not had any other chest pain with rest or exertion. No baseline exertional dyspnea.  No claudication.  ROS: A comprehensive was performed. Review of Systems  Constitutional: Negative for malaise/fatigue.  HENT: Negative for congestion.   Respiratory: Negative for cough, shortness of breath and wheezing.   Gastrointestinal: Negative for blood in stool, heartburn and melena.  Genitourinary: Negative for hematuria.  Musculoskeletal: Negative.   Skin: Negative.   Neurological: Negative.   Endo/Heme/Allergies: Negative for environmental allergies.  Psychiatric/Behavioral: The patient is nervous/anxious (since seeing Aortic Atheroscleosis on CXR).   All other systems reviewed and are negative.   I have reviewed and (if needed) personally updated the patient's problem list, medications, allergies, past medical and surgical history, social and family history.   Past Medical History:  Diagnosis Date  . Allergy   . Arthritis     Past Surgical History:  Procedure Laterality Date  . APPENDECTOMY    . LAPAROSCOPIC APPENDECTOMY N/A 10/18/2015   Procedure: APPENDECTOMY  LAPAROSCOPIC;  Surgeon: Johnathan Hausen, MD;  Location: [facility] ORS;  Service: General;  Laterality: N/A;  . NECK SURGERY    . SPINE SURGERY      Current Meds  Medication Sig  . meloxicam (MOBIC) 15 MG tablet Take 15 mg by mouth as needed for pain.    No Known Allergies  Social History   Social History  . Marital status: Married    Spouse name: N/A    . Number of children: 4  . Years of education: 7   Occupational History  .  [employer]    [employer] Control    Social History Main Topics  . Smoking status: Former Smoker    Quit date: 03/18/2013  . Smokeless tobacco: Former Systems developer    Types: Chew  . Alcohol use No  . Drug use: No  . Sexual activity: Yes    Birth control/ protection: None   Other Topics Concern  . None   Social History Narrative   He is a native of the [place]. He is married for 25 years with 4 children. He lives with his wife and 4 children.   He works here locally in [place] having graduated from [employer].   Does not do routine exercise, but does walk all the time at work and is quite busy being very active.    family history includes Cancer in his father; Heart attack (age of onset: 57) in his mother; Sudden death in his cousin.  4 brothers and 2 sisters are all relatively healthy.  Wt Readings from Last 3 Encounters:  01/17/17 150 lb (68 kg)  01/07/17 150 lb (68 kg)  01/03/17 152 lb (68.9 kg)    PHYSICAL EXAM BP 107/73 (BP Location: Left Arm, Patient Position: Sitting, Cuff Size: Normal)   Pulse 64   Ht 5\' 9"  (1.753 m)   Wt 150 lb (68 kg)   BMI 22.15 kg/m  General appearance: alert, cooperative, appears stated age, no distress. Well-nourished, well-groomed HEENT: [place]/AT, EOMI, MMM, anicteric sclera Neck: no adenopathy, no carotid bruit and no JVD Lungs: clear to auscultation bilaterally, normal percussion bilaterally and non-labored Heart: regular rate and rhythm, S1 &S2 normal, no murmur, click, rub or gallop; nondisplaced PMI;  some mild reproducible chest discomfort along the left side of his chest. Abdomen: soft, non-tender; bowel sounds normal; no masses,  no organomegaly; no HJR Extremities: extremities normal, atraumatic, no cyanosis, or edema Pulses: 2+ and symmetric;  Skin: normal, mobility and turgor normal and no evidence of bleeding or bruising Neurologic: Mental  status: Alert & oriented x 3, thought content appropriate; non-focal exam.  Pleasant mood & affect. Cranial nerves: normal (II-XII grossly intact)    Adult ECG Report - read of ER EKG  Rate: 67 ;  Rhythm: normal sinus rhythm and Normal intervals and durations.;   Narrative Interpretation: Normal EKG - Compared ER EKG showing sinus rhythm with possible incomplete right bundle branch block with ~peak precordial T waves and T-wave inversions in V1  Other studies Reviewed: Additional studies/ records that were reviewed today include:  Recent Labs:  n/a   ASSESSMENT / PLAN: Problem List Items Addressed This Visit    Aortic atherosclerosis ([place])    Interesting finding on chest x-ray that is not usually reported. With his presentation of chest pain, plan is to check Coronary Calcium Score to assess the extent of atherosclerosis in his coronary arteries beyond the thoracic aorta.      Relevant Orders   CT CARDIAC  SCORING   Chest pain with low risk for cardiac etiology - Primary    Concerning features of his chest pain is that the description of where it was located and associated with fast heart rate and shortness of breath. It lasted 2 hours without positive troponins. Other concerning factor was definite EKG difference with ER EKG versus the EKG done at his PCPs office. T-wave inversions in V1 and somewhat peaked T waves in the precordial leads were different, albeit nonspecific.  Plan: Based on finding of aortic calcification on chest x-ray, we will perform coronary CTA screening to evaluate for coronary atherosclerosis. If this is abnormal, we would then follow through with a stress test either GXT or coronary CTA. Without having evidence of coronary disease, I'm reluctant to do imaging stress test given his lack of risk factors.      Relevant Orders   CT CARDIAC SCORING      Current medicines are reviewed at length with the patient today. (+/- concerns) n/a The following changes have  been made: n/a  Patient Instructions  Schedule at [place] has requested that you have cardiac CT CAL ICUM SCORING. Cardiac computed tomography (CT) is a painless test that uses an x-ray machine to take clear, detailed pictures of your heart. For further information please visit [email or web address]. Please follow instruction sheet as given.    NO CHANGE WITH MEDICATIONS   Your physician recommends that you schedule a follow-up appointment in [place].    Studies Ordered:   Orders Placed This Encounter  Procedures  . CT CARDIAC SCORING      Glenetta Hew, M.D., M.S. Interventional Cardiologist   Pager # [phone] Phone # [phone] [address] [place], [place] [address]

## 2017-01-19 ENCOUNTER — Encounter: Payer: Self-pay | Admitting: Cardiology

## 2017-01-19 DIAGNOSIS — R079 Chest pain, unspecified: Secondary | ICD-10-CM | POA: Insufficient documentation

## 2017-01-19 DIAGNOSIS — I700 Atherosclerosis of aorta: Secondary | ICD-10-CM | POA: Insufficient documentation

## 2017-01-19 NOTE — Assessment & Plan Note (Signed)
Concerning features of his chest pain is that the description of where it was located and associated with fast heart rate and shortness of breath. It lasted 2 hours without positive troponins. Other concerning factor was definite EKG difference with ER EKG versus the EKG done at his PCPs office. T-wave inversions in V1 and somewhat peaked T waves in the precordial leads were different, albeit nonspecific.  Plan: Based on finding of aortic calcification on chest x-ray, we will perform coronary CTA screening to evaluate for coronary atherosclerosis. If this is abnormal, we would then follow through with a stress test either GXT or coronary CTA. Without having evidence of coronary disease, I'm reluctant to do imaging stress test given his lack of risk factors.

## 2017-01-19 NOTE — Assessment & Plan Note (Signed)
Interesting finding on chest x-ray that is not usually reported. With his presentation of chest pain, plan is to check Coronary Calcium Score to assess the extent of atherosclerosis in his coronary arteries beyond the thoracic aorta.

## 2017-01-20 ENCOUNTER — Ambulatory Visit (INDEPENDENT_AMBULATORY_CARE_PROVIDER_SITE_OTHER)
Admission: RE | Admit: 2017-01-20 | Discharge: 2017-01-20 | Disposition: A | Discharge status: Home / Self Care | Payer: Self-pay | Source: Ambulatory Visit | Attending: Cardiology | Admitting: Cardiology

## 2017-01-20 DIAGNOSIS — I700 Atherosclerosis of aorta: Secondary | ICD-10-CM

## 2017-01-20 DIAGNOSIS — R079 Chest pain, unspecified: Secondary | ICD-10-CM

## 2017-02-24 ENCOUNTER — Ambulatory Visit: Payer: BLUE CROSS/BLUE SHIELD | Admitting: Cardiology

## 2017-06-09 ENCOUNTER — Ambulatory Visit (INDEPENDENT_AMBULATORY_CARE_PROVIDER_SITE_OTHER): Payer: BLUE CROSS/BLUE SHIELD | Admitting: Physician Assistant

## 2017-06-09 VITALS — BP 110/62 | HR 68 | Temp 98.2°F | Resp 16 | Ht 69.0 in | Wt 153.0 lb

## 2017-06-09 DIAGNOSIS — R109 Unspecified abdominal pain: Secondary | ICD-10-CM

## 2017-06-09 DIAGNOSIS — M549 Dorsalgia, unspecified: Secondary | ICD-10-CM | POA: Diagnosis not present

## 2017-06-09 LAB — POCT URINALYSIS DIP (MANUAL ENTRY)
BILIRUBIN UA: NEGATIVE
BILIRUBIN UA: NEGATIVE mg/dL
GLUCOSE UA: NEGATIVE mg/dL
Leukocytes, UA: NEGATIVE
Nitrite, UA: NEGATIVE
PH UA: 5.5 (ref 5.0–8.0)
Protein Ur, POC: NEGATIVE mg/dL
RBC UA: NEGATIVE
Urobilinogen, UA: 0.2 E.U./dL

## 2017-06-09 MED ORDER — CYCLOBENZAPRINE HCL 10 MG PO TABS: 5.0000 mg | ORAL_TABLET | Freq: Three times a day (TID) | ORAL | 0 refills | Status: DC | PRN

## 2017-06-09 MED ORDER — MELOXICAM 15 MG PO TABS: 15.0000 mg | ORAL_TABLET | Freq: Every day | ORAL | 0 refills | Status: DC

## 2017-06-09 NOTE — Patient Instructions (Addendum)
This appears to be musculoskeletal.   I would like you to try icing the area.   I am giving you an anti-inflammatory, meloxicam.  Do not take ibuprofen or naproxen with this.  I am also giving you a muscle relaxant, flexeril.  Do not operate heavy machinery, such as driving with this medication.   Please do small stretches of your core (abdomen and back) three times per day.  Ice directly after.  This can be 15 minutes three times per day.    IF you received an x-ray today, you will receive an invoice from [facility]. Please contact [facility] at [phone] with questions or concerns regarding your invoice.   IF you received labwork today, you will receive an invoice from [facility]. Please contact [facility] at [phone] with questions or concerns regarding your invoice.   Our billing staff will not be able to assist you with questions regarding bills from these companies.  You will be contacted with the lab results as soon as they are available. The fastest way to get your results is to activate your My Chart account. Instructions are located on the last page of this paperwork. If you have not heard from [place] regarding the results in 2 weeks, please contact this office.

## 2017-06-09 NOTE — Progress Notes (Signed)
PRIMARY CARE AT [facility] [address] [place] [address] [phone]  Date:  06/09/2017   Name:  Jay Osborn   DOB:  Jul 03, 1961   MRN:  [id number]  PCP:  [name], PA-C    History of Present Illness:  Montrey Buist is a 56 y.o. male patient who presents to PCP with  Chief Complaint  Patient presents with  . Flank Pain    right side/ x 1 month.      Flank pain 1 month ago, feels kind of stinging.  Feels warm and with a dull pain at times.  Pain is constant as the dull and aching pain.  No rash.  No trauma or heavy lifting.  No swelling. No dysuria, hematuria.  He does notice frequency.  No nausea.  2/10.  No history of kidney stones.    Patient Active Problem List   Diagnosis Date Noted  . Aortic atherosclerosis ([place]) 01/19/2017  . Chest pain with low risk for cardiac etiology 01/19/2017  . Poison ivy dermatitis 10/24/2016  . Itching 10/24/2016  . Acute appendicitis 10/18/2015    Past Medical History:  Diagnosis Date  . Allergy   . Arthritis     Past Surgical History:  Procedure Laterality Date  . APPENDECTOMY    . APPENDECTOMY LAPAROSCOPIC N/A 10/18/2015   Performed by Johnathan Hausen, MD at [facility] ORS  . NECK SURGERY    . SPINE SURGERY      Social History   Tobacco Use  . Smoking status: Former Smoker    Last attempt to quit: 03/18/2013    Years since quitting: 4.2  . Smokeless tobacco: Former Systems developer    Types: Chew  Substance Use Topics  . Alcohol use: No    Alcohol/week: 0.0 oz  . Drug use: No    Family History  Problem Relation Age of Onset  . Cancer Father        prostate  . Heart attack Mother 64  . Sudden death Cousin     No Known Allergies  Medication list has been reviewed and updated.  Current Outpatient Medications on File Prior to Visit  Medication Sig Dispense Refill  . meloxicam (MOBIC) 15 MG tablet Take 15 mg by mouth as needed for pain.     No current facility-administered medications on file prior to visit.      ROS ROS otherwise unremarkable unless listed above.  Physical Examination: BP 110/62   Pulse 68   Temp 98.2 F (36.8 C) (Oral)   Resp 16   Ht 5\' 9"  (1.753 m)   Wt 153 lb (69.4 kg)   SpO2 100%   BMI 22.59 kg/m  Ideal Body Weight: Weight in (lb) to have BMI = 25: 168.9  Physical Exam  Abdominal: Soft. Normal appearance and bowel sounds are normal.  Tender at the right axilla of the lower abdominal quadrant. No mass or swelling or ecchymosis appreciated.     Musculoskeletal:  Pain incited with hip adduction and mildly with hip flexion.      Results for orders placed or performed in visit on 06/09/17  POCT urinalysis dipstick  Result Value Ref Range   Color, UA yellow yellow   Clarity, UA clear clear   Glucose, UA negative negative mg/dL   Bilirubin, UA negative negative   Ketones, POC UA negative negative mg/dL   Spec Grav, UA >=1.030 (A) 1.010 - 1.025   Blood, UA negative negative   pH, UA 5.5 5.0 - 8.0   Protein  Ur, POC negative negative mg/dL   Urobilinogen, UA 0.2 0.2 or 1.0 E.U./dL   Nitrite, UA Negative Negative   Leukocytes, UA Negative Negative      Assessment and Plan: Tivon Trompeter is a 56 y.o. male who is here today for cc of  Chief Complaint  Patient presents with  . Flank Pain    right side/ x 1 month.    Appears to be musculoskeletal in etiology at this time.  Advised icing, and stretches.   Will follow up within 2 weeks if no improvement.   Advised of alarming symptoms to warrant an immediate return.  Flank pain - Plan: POCT urinalysis dipstick  Musculoskeletal back pain - Plan: cyclobenzaprine (FLEXERIL) 10 MG tablet, meloxicam (MOBIC) 15 MG tablet  Ivar Drape, PA-C [facility] 11/25/20184:09 PM

## 2017-07-03 ENCOUNTER — Encounter: Payer: BLUE CROSS/BLUE SHIELD | Admitting: Urgent Care

## 2017-07-10 ENCOUNTER — Ambulatory Visit (INDEPENDENT_AMBULATORY_CARE_PROVIDER_SITE_OTHER): Payer: BLUE CROSS/BLUE SHIELD | Admitting: Urgent Care

## 2017-07-10 ENCOUNTER — Encounter: Payer: Self-pay | Admitting: Urgent Care

## 2017-07-10 VITALS — BP 116/70 | HR 71 | Temp 98.2°F | Resp 16 | Ht 69.0 in | Wt 151.6 lb

## 2017-07-10 DIAGNOSIS — Z23 Encounter for immunization: Secondary | ICD-10-CM

## 2017-07-10 DIAGNOSIS — Z1211 Encounter for screening for malignant neoplasm of colon: Secondary | ICD-10-CM

## 2017-07-10 DIAGNOSIS — Z0000 Encounter for general adult medical examination without abnormal findings: Secondary | ICD-10-CM | POA: Diagnosis not present

## 2017-07-10 NOTE — Progress Notes (Signed)
MRN: [id number]  Subjective:   [name] is a 56 y.o. male presenting for annual physical exam. Patient is married, has children. Works in Therapist, art. Has good relationships at home, has a good support network. Denies smoking cigarettes or drinking alcohol.   Medical care team includes: PCP: [name], PA-C Vision: No visual deficits. Dental: Does not get regular dental cleanings. Specialists: None.  Health Maintenance: Has never had a colonoscopy, is agreeable to scheduling this.  [name] has a current medication list which includes the following prescription(s): cyclobenzaprine and meloxicam. He has No Known Allergies. [name]  has a past medical history of Allergy and Arthritis. Also  has a past surgical history that includes Neck surgery; laparoscopic appendectomy (N/A, 10/18/2015); Appendectomy; and Spine surgery. Her family history includes Cancer in his father; Heart attack (age of onset: 4) in his mother; Sudden death in his cousin.  Immunizations: Flu vaccine updated today.  Review of Systems  Constitutional: Negative for chills, diaphoresis, fever, malaise/fatigue and weight loss.  HENT: Negative for congestion, ear discharge, ear pain, hearing loss, nosebleeds, sore throat and tinnitus.   Eyes: Negative for blurred vision, double vision, photophobia, pain, discharge and redness.  Respiratory: Negative for cough, shortness of breath and wheezing.   Cardiovascular: Negative for chest pain, palpitations and leg swelling.  Gastrointestinal: Negative for abdominal pain, blood in stool, constipation, diarrhea, nausea and vomiting.  Genitourinary: Negative for dysuria, flank pain, frequency, hematuria and urgency.  Musculoskeletal: Negative for back pain, joint pain and myalgias.  Skin: Negative for itching and rash.  Neurological: Negative for dizziness, tingling, seizures, loss of consciousness, weakness and headaches.  Endo/Heme/Allergies:  Negative for polydipsia.  Psychiatric/Behavioral: Negative for depression, hallucinations, memory loss, substance abuse and suicidal ideas. The patient is not nervous/anxious and does not have insomnia.    Objective:   Vitals: BP 116/70   Pulse 71   Temp 98.2 F (36.8 C) (Oral)   Resp 16   Ht 5\' 9"  (1.753 m)   Wt 151 lb 9.6 oz (68.8 kg)   SpO2 99%   BMI 22.39 kg/m    Visual Acuity Screening   Right eye Left eye Both eyes  Without correction: 20/13-2 20/15-1 20/13-1  With correction:       Physical Exam  Constitutional: He is oriented to person, place, and time. He appears well-developed and well-nourished.  HENT:  TM's intact bilaterally, no effusions or erythema. Nasal turbinates pink and moist, nasal passages patent. No sinus tenderness. Oropharynx clear, mucous membranes moist, dentition in good repair.  Eyes: Conjunctivae and EOM are normal. Pupils are equal, round, and reactive to light. Right eye exhibits no discharge. Left eye exhibits no discharge. No scleral icterus.  Neck: Normal range of motion. Neck supple. No thyromegaly present.  Cardiovascular: Normal rate, regular rhythm and intact distal pulses. Exam reveals no gallop and no friction rub.  No murmur heard. Pulmonary/Chest: No stridor. No respiratory distress. He has no wheezes. He has no rales.  Abdominal: Soft. Bowel sounds are normal. He exhibits no distension and no mass. There is no tenderness.  Musculoskeletal: Normal range of motion. He exhibits no edema or tenderness.  Lymphadenopathy:    He has no cervical adenopathy.  Neurological: He is alert and oriented to person, place, and time. He has normal reflexes. Coordination normal.  Skin: Skin is warm and dry. No rash noted. No erythema. No pallor.  Psychiatric: He has a normal mood and affect.   Assessment and Plan :  Annual physical exam - Plan: Comprehensive metabolic panel, Lipid panel, TSH, CBC  Need for influenza vaccination - Plan: Flu  Vaccine QUAD 6+ mos PF IM (Fluarix Quad PF)  Screening for colon cancer  Medically healthy, very pleasant person, labs pending. Patient will schedule colonoscopy. Immunizations updated today.   [name], PA-C Primary Care at [facility] [phone] 07/10/2017  9:08 AM

## 2017-07-10 NOTE — Patient Instructions (Signed)
Health Maintenance, Male A healthy lifestyle and preventive care is important for your health and wellness. Ask your health care provider about what schedule of regular examinations is right for you. What should I know about weight and diet? Eat a Healthy Diet  Eat plenty of vegetables, fruits, whole grains, low-fat dairy products, and lean protein.  Do not eat a lot of foods high in solid fats, added sugars, or salt.  Maintain a Healthy Weight Regular exercise can help you achieve or maintain a healthy weight. You should:  Do at least 150 minutes of exercise each week. The exercise should increase your heart rate and make you sweat (moderate-intensity exercise).  Do strength-training exercises at least twice a week.  Watch Your Levels of Cholesterol and Blood Lipids  Have your blood tested for lipids and cholesterol every 5 years starting at 56 years of age. If you are at high risk for heart disease, you should start having your blood tested when you are 56 years old. You may need to have your cholesterol levels checked more often if: ? Your lipid or cholesterol levels are high. ? You are older than 56 years of age. ? You are at high risk for heart disease.  What should I know about cancer screening? Many types of cancers can be detected early and may often be prevented. Lung Cancer  You should be screened every year for lung cancer if: ? You are a current smoker who has smoked for at least 30 years. ? You are a former smoker who has quit within the past 15 years.  Talk to your health care provider about your screening options, when you should start screening, and how often you should be screened.  Colorectal Cancer  Routine colorectal cancer screening usually begins at 56 years of age and should be repeated every 5-10 years until you are 56 years old. You may need to be screened more often if early forms of precancerous polyps or small growths are found. Your health care provider  may recommend screening at an earlier age if you have risk factors for colon cancer.  Your health care provider may recommend using home test kits to check for hidden blood in the stool.  A small camera at the end of a tube can be used to examine your colon (sigmoidoscopy or colonoscopy). This checks for the earliest forms of colorectal cancer.  Prostate and Testicular Cancer  Depending on your age and overall health, your health care provider may do certain tests to screen for prostate and testicular cancer.  Talk to your health care provider about any symptoms or concerns you have about testicular or prostate cancer.  Skin Cancer  Check your skin from head to toe regularly.  Tell your health care provider about any new moles or changes in moles, especially if: ? There is a change in a mole's size, shape, or color. ? You have a mole that is larger than a pencil eraser.  Always use sunscreen. Apply sunscreen liberally and repeat throughout the day.  Protect yourself by wearing long sleeves, pants, a wide-brimmed hat, and sunglasses when outside.  What should I know about heart disease, diabetes, and high blood pressure?  If you are 18-39 years of age, have your blood pressure checked every 3-5 years. If you are 40 years of age or older, have your blood pressure checked every year. You should have your blood pressure measured twice-once when you are at a hospital or clinic, and once when   you are not at a hospital or clinic. Record the average of the two measurements. To check your blood pressure when you are not at a hospital or clinic, you can use: ? An automated blood pressure machine at a pharmacy. ? A home blood pressure monitor.  Talk to your health care provider about your target blood pressure.  If you are between 45-79 years old, ask your health care provider if you should take aspirin to prevent heart disease.  Have regular diabetes screenings by checking your fasting blood  sugar level. ? If you are at a normal weight and have a low risk for diabetes, have this test once every three years after the age of 45. ? If you are overweight and have a high risk for diabetes, consider being tested at a younger age or more often.  A one-time screening for abdominal aortic aneurysm (AAA) by ultrasound is recommended for men aged 65-75 years who are current or former smokers. What should I know about preventing infection? Hepatitis B If you have a higher risk for hepatitis B, you should be screened for this virus. Talk with your health care provider to find out if you are at risk for hepatitis B infection. Hepatitis C Blood testing is recommended for:  Everyone born from 1945 through 1965.  Anyone with known risk factors for hepatitis C.  Sexually Transmitted Diseases (STDs)  You should be screened each year for STDs including gonorrhea and chlamydia if: ? You are sexually active and are younger than 56 years of age. ? You are older than 56 years of age and your health care provider tells you that you are at risk for this type of infection. ? Your sexual activity has changed since you were last screened and you are at an increased risk for chlamydia or gonorrhea. Ask your health care provider if you are at risk.  Talk with your health care provider about whether you are at high risk of being infected with HIV. Your health care provider may recommend a prescription medicine to help prevent HIV infection.  What else can I do?  Schedule regular health, dental, and eye exams.  Stay current with your vaccines (immunizations).  Do not use any tobacco products, such as cigarettes, chewing tobacco, and e-cigarettes. If you need help quitting, ask your health care provider.  Limit alcohol intake to no more than 2 drinks per day. One drink equals 12 ounces of beer, 5 ounces of wine, or 1 ounces of hard liquor.  Do not use street drugs.  Do not share needles.  Ask your  health care provider for help if you need support or information about quitting drugs.  Tell your health care provider if you often feel depressed.  Tell your health care provider if you have ever been abused or do not feel safe at home. This information is not intended to replace advice given to you by your health care provider. Make sure you discuss any questions you have with your health care provider. Document Released: 01/04/2008 Document Revised: 03/06/2016 Document Reviewed: 04/11/2015 Elsevier Interactive Patient Education  2018 [employer]     IF you received an x-ray today, you will receive an invoice from [facility]. Please contact [facility] at [phone] with questions or concerns regarding your invoice.   IF you received labwork today, you will receive an invoice from [facility]. Please contact [facility] at [phone] with questions or concerns regarding your invoice.   Our billing staff will not be   able to assist you with questions regarding bills from these companies.  You will be contacted with the lab results as soon as they are available. The fastest way to get your results is to activate your My Chart account. Instructions are located on the last page of this paperwork. If you have not heard from us regarding the results in 2 weeks, please contact this office.       

## 2017-07-11 LAB — CBC
HEMATOCRIT: 40.4 % (ref 37.5–51.0)
HEMOGLOBIN: 13.4 g/dL (ref 13.0–17.7)
MCH: 28.3 pg (ref 26.6–33.0)
MCHC: 33.2 g/dL (ref 31.5–35.7)
MCV: 85 fL (ref 79–97)
Platelets: 211 10*3/uL (ref 150–379)
RBC: 4.73 x10E6/uL (ref 4.14–5.80)
RDW: 13.2 % (ref 12.3–15.4)
WBC: 4.7 10*3/uL (ref 3.4–10.8)

## 2017-07-11 LAB — LIPID PANEL
CHOL/HDL RATIO: 2.8 ratio (ref 0.0–5.0)
Cholesterol, Total: 145 mg/dL (ref 100–199)
HDL: 52 mg/dL (ref >39)
LDL Calculated: 85 mg/dL (ref 0–99)
TRIGLYCERIDES: 41 mg/dL (ref 0–149)
VLDL Cholesterol Cal: 8 mg/dL (ref 5–40)

## 2017-07-11 LAB — TSH: TSH: 2.47 u[IU]/mL (ref 0.450–4.500)

## 2017-07-11 LAB — COMPREHENSIVE METABOLIC PANEL
ALT: 10 IU/L (ref 0–44)
AST: 17 IU/L (ref 0–40)
Albumin/Globulin Ratio: 1.7 (ref 1.2–2.2)
Albumin: 4.3 g/dL (ref 3.5–5.5)
Alkaline Phosphatase: 68 IU/L (ref 39–117)
BUN/Creatinine Ratio: 13 (ref 9–20)
BUN: 10 mg/dL (ref 6–24)
Bilirubin Total: 0.4 mg/dL (ref 0.0–1.2)
CALCIUM: 8.9 mg/dL (ref 8.7–10.2)
CO2: 26 mmol/L (ref 20–29)
Chloride: 100 mmol/L (ref 96–106)
Creatinine, Ser: 0.77 mg/dL (ref 0.76–1.27)
GFR calc Af Amer: 117 mL/min/{1.73_m2} (ref >59)
GFR, EST NON AFRICAN AMERICAN: 101 mL/min/{1.73_m2} (ref >59)
GLUCOSE: 74 mg/dL (ref 65–99)
Globulin, Total: 2.6 g/dL (ref 1.5–4.5)
POTASSIUM: 3.8 mmol/L (ref 3.5–5.2)
Sodium: 138 mmol/L (ref 134–144)
Total Protein: 6.9 g/dL (ref 6.0–8.5)

## 2017-10-10 ENCOUNTER — Ambulatory Visit (INDEPENDENT_AMBULATORY_CARE_PROVIDER_SITE_OTHER): Payer: BLUE CROSS/BLUE SHIELD | Admitting: Urgent Care

## 2017-10-10 ENCOUNTER — Ambulatory Visit (INDEPENDENT_AMBULATORY_CARE_PROVIDER_SITE_OTHER): Payer: BLUE CROSS/BLUE SHIELD

## 2017-10-10 ENCOUNTER — Encounter: Payer: Self-pay | Admitting: Urgent Care

## 2017-10-10 VITALS — BP 101/67 | HR 87 | Temp 98.0°F | Resp 17 | Ht 69.5 in | Wt 146.0 lb

## 2017-10-10 DIAGNOSIS — Z8042 Family history of malignant neoplasm of prostate: Secondary | ICD-10-CM

## 2017-10-10 DIAGNOSIS — M545 Low back pain, unspecified: Secondary | ICD-10-CM

## 2017-10-10 DIAGNOSIS — M25552 Pain in left hip: Secondary | ICD-10-CM | POA: Diagnosis not present

## 2017-10-10 DIAGNOSIS — M25551 Pain in right hip: Secondary | ICD-10-CM | POA: Diagnosis not present

## 2017-10-10 DIAGNOSIS — M549 Dorsalgia, unspecified: Secondary | ICD-10-CM

## 2017-10-10 MED ORDER — MELOXICAM 15 MG PO TABS: 15.0000 mg | ORAL_TABLET | Freq: Every day | ORAL | 0 refills | Status: DC

## 2017-10-10 NOTE — Progress Notes (Signed)
    MRN: [id number] DOB: 01/14/1961  Subjective:   Jay Osborn is a 57 y.o. male presenting for several year history of low back pain, bilateral hip pain. Reports that his pain is mild but frequent and achy in nature, does not radiate. He does feel pelvic fullness and discomfort intermittently. Denies weakness, numbness or tingling, incontinence, straining to urinate, dysuria, hematuria, urinary frequency. Patient hydrates well, has sedentary job. 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 Eats healthy foods.   Jay Osborn has a current medication list which includes the following prescription(s): cyclobenzaprine and meloxicam. Also has No Known Allergies.  Jay Osborn  has a past medical history of Allergy and Arthritis. Also  has a past surgical history that includes Neck surgery; laparoscopic appendectomy (N/A, 10/18/2015); Appendectomy; and Spine surgery.  Objective:   Vitals: BP 101/67   Pulse 87   Temp 98 F (36.7 C) (Oral)   Resp 17   Ht 5' 9.5" (1.765 m)   Wt 146 lb (66.2 kg)   SpO2 98%   BMI 21.25 kg/m   Physical Exam  Constitutional: He is oriented to person, place, and time. He appears well-developed and well-nourished.  Cardiovascular: Normal rate.  Pulmonary/Chest: Effort normal.  Abdominal: Soft. Bowel sounds are normal. He exhibits no distension and no mass. There is no tenderness. There is no guarding.  Musculoskeletal:       Lumbar back: He exhibits normal range of motion, no tenderness, no bony tenderness, no swelling, no edema, no deformity, no laceration and no spasm.  Negative SLR.  Neurological: He is alert and oriented to person, place, and time. He displays normal reflexes. Coordination normal.  Skin: Skin is warm and dry.  Psychiatric: He has a normal mood and affect.   Dg Lumbar Spine Complete  Result Date: 10/10/2017 CLINICAL DATA:  Lumbago EXAM: LUMBAR SPINE - COMPLETE 4+ VIEW COMPARISON:  None. FINDINGS: Frontal, lateral,  spot lumbosacral lateral, and bilateral oblique views were obtained. There are 4 non-rib-bearing lumbar type vertebral bodies. There is no fracture or spondylolisthesis. The disc spaces appear normal. There is no appreciable facet arthropathy. IMPRESSION: No fracture or spondylolisthesis.  No appreciable arthropathy. Electronically Signed   By: Lowella Grip III M.D.   On: 10/10/2017 12:07   Dg Hips Bilat W Or W/o Pelvis 2v  Result Date: 10/10/2017 CLINICAL DATA:  Bilateral hip region pain EXAM: DG HIP (WITH OR WITHOUT PELVIS) 4+V BILAT COMPARISON:  None. FINDINGS: Frontal pelvis as well as frontal and lateral hip views bilaterally-total five views-obtained. No fracture or dislocation. Joint spaces appear symmetric and normal. No erosive changes. IMPRESSION: No fracture or dislocation.  No evident arthropathy. Electronically Signed   By: Lowella Grip III M.D.   On: 10/10/2017 12:06   Assessment and Plan :   Acute bilateral low back pain without sciatica - Plan: DG Lumbar Spine Complete  Bilateral hip pain - Plan: DG HIPS BILAT W OR W/O PELVIS 2V  Musculoskeletal back pain - Plan: meloxicam (MOBIC) 15 MG tablet  Family history of prostate cancer - Plan: PSA  Physical exam findings reassuring. PSA level pending. Will have patient modify work activities, refilled meloxicam. Consider PT. Follow up with results.  Jay Eagles, PA-C [facility] at [facility] [phone] 10/10/2017  11:28 AM

## 2017-10-10 NOTE — Patient Instructions (Addendum)
You may take 500mg  Tylenol every 6 hours for back/hip pain and inflammation.    Back Pain, Adult Many adults have back pain from time to time. Common causes of back pain include:  A strained muscle or ligament.  Wear and tear (degeneration) of the spinal disks.  Arthritis.  A hit to the back.  Back pain can be short-lived (acute) or last a long time (chronic). A physical exam, lab tests, and imaging studies may be done to find the cause of your pain. Follow these instructions at home: Managing pain and stiffness  Take over-the-counter and prescription medicines only as told by your health care provider.  If directed, apply heat to the affected area as often as told by your health care provider. Use the heat source that your health care provider recommends, such as a moist heat pack or a heating pad. ? Place a towel between your skin and the heat source. ? Leave the heat on for 20-30 minutes. ? Remove the heat if your skin turns bright red. This is especially important if you are unable to feel pain, heat, or cold. You have a greater risk of getting burned.  If directed, apply ice to the injured area: ? Put ice in a plastic bag. ? Place a towel between your skin and the bag. ? Leave the ice on for 20 minutes, 2-3 times a day for the first 2-3 days. Activity  Do not stay in bed. Resting more than 1-2 days can delay your recovery.  Take short walks on even surfaces as soon as you are able. Try to increase the length of time you walk each day.  Do not sit, drive, or stand in one place for more than 30 minutes at a time. Sitting or standing for long periods of time can put stress on your back.  Use proper lifting techniques. When you bend and lift, use positions that put less stress on your back: ? [place] your knees. ? Keep the load close to your body. ? Avoid twisting.  Exercise regularly as told by your health care provider. Exercising will help your back heal faster. This also  helps prevent back injuries by keeping muscles strong and flexible.  Your health care provider may recommend that you see a physical therapist. This person can help you come up with a safe exercise program. Do any exercises as told by your physical therapist. Lifestyle  Maintain a healthy weight. Extra weight puts stress on your back and makes it difficult to have good posture.  Avoid activities or situations that make you feel anxious or stressed. Learn ways to manage anxiety and stress. One way to manage stress is through exercise. Stress and anxiety increase muscle tension and can make back pain worse. General instructions  Sleep on a firm mattress in a comfortable position. Try lying on your side with your knees slightly bent. If you lie on your back, put a pillow under your knees.  Follow your treatment plan as told by your health care provider. This may include: ? Cognitive or behavioral therapy. ? Acupuncture or massage therapy. ? Meditation or yoga. Contact a health care provider if:  You have pain that is not relieved with rest or medicine.  You have increasing pain going down into your legs or buttocks.  Your pain does not improve in 2 weeks.  You have pain at night.  You lose weight.  You have a fever or chills. Get help right away if:  You develop  new bowel or bladder control problems.  You have unusual weakness or numbness in your arms or legs.  You develop nausea or vomiting.  You develop abdominal pain.  You feel faint. Summary  Many adults have back pain from time to time. A physical exam, lab tests, and imaging studies may be done to find the cause of your pain.  Use proper lifting techniques. When you bend and lift, use positions that put less stress on your back.  Take over-the-counter and prescription medicines and apply heat or ice as directed by your health care provider. This information is not intended to replace advice given to you by your  health care provider. Make sure you discuss any questions you have with your health care provider. Document Released: 07/08/2005 Document Revised: 08/12/2016 Document Reviewed: 08/12/2016 Elsevier Interactive Patient Education  2018 [employer].     Hip Pain The hip is the joint between the upper legs and the lower pelvis. The bones, cartilage, tendons, and muscles of your hip joint support your body and allow you to move around. Hip pain can range from a minor ache to severe pain in one or both of your hips. The pain may be felt on the inside of the hip joint near the groin, or the outside near the buttocks and upper thigh. You may also have swelling or stiffness. Follow these instructions at home: Managing pain, stiffness, and swelling  If directed, apply ice to the injured area. ? Put ice in a plastic bag. ? Place a towel between your skin and the bag. ? Leave the ice on for 20 minutes, 2-3 times a day  Sleep with a pillow between your legs on your most comfortable side.  Avoid any activities that cause pain. General instructions  Take over-the-counter and prescription medicines only as told by your health care provider.  Do any exercises as told by your health care provider.  Record the following: ? How often you have hip pain. ? The location of your pain. ? What the pain feels like. ? What makes the pain worse.  Keep all follow-up visits as told by your health care provider. This is important. Contact a health care provider if:  You cannot put weight on your leg.  Your pain or swelling continues or gets worse after one week.  It gets harder to walk.  You have a fever. Get help right away if:  You fall.  You have a sudden increase in pain and swelling in your hip.  Your hip is red or swollen or very tender to touch. Summary  Hip pain can range from a minor ache to severe pain in one or both of your hips.  The pain may be felt on the inside of the hip joint  near the groin, or the outside near the buttocks and upper thigh.  Avoid any activities that cause pain.  Record how often you have hip pain, the location of the pain, what makes it worse and what it feels like. This information is not intended to replace advice given to you by your health care provider. Make sure you discuss any questions you have with your health care provider. Document Released: 12/26/2009 Document Revised: 06/10/2016 Document Reviewed: 06/10/2016 Elsevier Interactive Patient Education  2018 [employer].     IF you received an x-ray today, you will receive an invoice from [facility]. Please contact [facility] at [phone] with questions or concerns regarding your invoice.   IF you received labwork  today, you will receive an invoice from [place]. Please contact [facility] at [phone] with questions or concerns regarding your invoice.   Our billing staff will not be able to assist you with questions regarding bills from these companies.  You will be contacted with the lab results as soon as they are available. The fastest way to get your results is to activate your My Chart account. Instructions are located on the last page of this paperwork. If you have not heard from [place] regarding the results in 2 weeks, please contact this office.

## 2017-10-11 LAB — PSA: PROSTATE SPECIFIC AG, SERUM: 1.7 ng/mL (ref 0.0–4.0)

## 2017-10-29 ENCOUNTER — Encounter: Payer: Self-pay | Admitting: Physician Assistant

## 2017-11-06 ENCOUNTER — Other Ambulatory Visit: Payer: Self-pay | Admitting: Urgent Care

## 2017-11-06 DIAGNOSIS — M549 Dorsalgia, unspecified: Secondary | ICD-10-CM

## 2018-09-08 ENCOUNTER — Encounter: Payer: Self-pay | Admitting: Family Medicine

## 2018-09-08 ENCOUNTER — Ambulatory Visit (INDEPENDENT_AMBULATORY_CARE_PROVIDER_SITE_OTHER): Payer: BLUE CROSS/BLUE SHIELD | Admitting: Family Medicine

## 2018-09-08 ENCOUNTER — Other Ambulatory Visit: Payer: Self-pay

## 2018-09-08 VITALS — BP 110/75 | HR 69 | Temp 98.2°F | Resp 14 | Ht 69.0 in | Wt 156.6 lb

## 2018-09-08 DIAGNOSIS — J22 Unspecified acute lower respiratory infection: Secondary | ICD-10-CM

## 2018-09-08 DIAGNOSIS — R05 Cough: Secondary | ICD-10-CM

## 2018-09-08 DIAGNOSIS — Z87898 Personal history of other specified conditions: Secondary | ICD-10-CM

## 2018-09-08 DIAGNOSIS — R059 Cough, unspecified: Secondary | ICD-10-CM

## 2018-09-08 MED ORDER — HYDROCODONE-HOMATROPINE 5-1.5 MG/5ML PO SYRP: ORAL_SOLUTION | ORAL | 0 refills | Status: DC

## 2018-09-08 MED ORDER — AZITHROMYCIN 250 MG PO TABS: ORAL_TABLET | ORAL | 0 refills | Status: DC

## 2018-09-08 MED ORDER — ALBUTEROL SULFATE HFA 108 (90 BASE) MCG/ACT IN AERS: 1.0000 | INHALATION_SPRAY | RESPIRATORY_TRACT | 0 refills | Status: AC | PRN

## 2018-09-08 MED ORDER — BENZONATATE 100 MG PO CAPS: 100.0000 mg | ORAL_CAPSULE | Freq: Three times a day (TID) | ORAL | 0 refills | Status: DC | PRN

## 2018-09-08 NOTE — Progress Notes (Signed)
Subjective:    Patient ID: Jay Osborn, male    DOB: [birth year], 58 y.o.   MRN: [id number]  HPI Ezechiel Mason is a 58 y.o. male Presents today for: Chief Complaint  Patient presents with  . Cough    been coughing for 2 weeks now with itchy lungs. Patient stated came back from [place] 3 wekks ago but was not in contact with any one sick just thing cough is due to change in weather. Have tried otc mucinex dm which did not help   Started 3 weeks ago, runny nose, chest congestion. Cough about 2 weeks.  No fever.   Discolored phlegm - yellow/green. Slightly better than 2 weeks ago  - nasal congestion better. Itching in chest main issue.  Cough at night- worse at night.  No hx of asthma/lung disease, but possible wheeze at times - itching then wheeze to cough. No heartburn.  [place] 3 weeks ago. Healthy then. Cough few days later.   Tx: mucinex DM.    Patient Active Problem List   Diagnosis Date Noted  . Aortic atherosclerosis ([place]) 01/19/2017  . Chest pain with low risk for cardiac etiology 01/19/2017  . Poison ivy dermatitis 10/24/2016  . Itching 10/24/2016  . Acute appendicitis 10/18/2015   Past Medical History:  Diagnosis Date  . Allergy   . Arthritis    Past Surgical History:  Procedure Laterality Date  . APPENDECTOMY    . LAPAROSCOPIC APPENDECTOMY N/A 10/18/2015   Procedure: APPENDECTOMY LAPAROSCOPIC;  Surgeon: Johnathan Hausen, MD;  Location: [facility];  Service: General;  Laterality: N/A;  . NECK SURGERY    . SPINE SURGERY     No Known Allergies Prior to Admission medications   Not on File   Social History   Socioeconomic History  . Marital status: Married    Spouse name: Not on file  . Number of children: 4  . Years of education: 84  . Highest education level: Not on file  Occupational History    Employer: [employer]    Comment: Quality Control   Social Needs  . Financial resource strain: Not on file  . Food insecurity:    Worry: Not on file   Inability: Not on file  . Transportation needs:    Medical: Not on file    Non-medical: Not on file  Tobacco Use  . Smoking status: Former Smoker    Last attempt to quit: 03/18/2013    Years since quitting: 5.4  . Smokeless tobacco: Former Systems developer    Types: Chew  Substance and Sexual Activity  . Alcohol use: No    Alcohol/week: 0.0 standard drinks  . Drug use: No  . Sexual activity: Yes    Birth control/protection: None  Lifestyle  . Physical activity:    Days per week: Not on file    Minutes per session: Not on file  . Stress: Not on file  Relationships  . Social connections:    Talks on phone: Not on file    Gets together: Not on file    Attends religious service: Not on file    Active member of club or organization: Not on file    Attends meetings of clubs or organizations: Not on file    Relationship status: Not on file  . Intimate partner violence:    Fear of current or ex partner: Not on file    Emotionally abused: Not on file    Physically abused: Not on file    Forced sexual  activity: Not on file  Other Topics Concern  . Not on file  Social History Narrative   He is a native of the [place]. He is married for 25 years with 4 children. He lives with his wife and 4 children.   He works here locally in [place] having graduated from [employer].   Does not do routine exercise, but does walk all the time at work and is quite busy being very active.    Review of Systems Per HPI    Objective:   Physical Exam Vitals signs reviewed.  Constitutional:      Appearance: He is well-developed.  HENT:     Head: Normocephalic and atraumatic.     Right Ear: Tympanic membrane, ear canal and external ear normal.     Left Ear: Tympanic membrane, ear canal and external ear normal.     Nose: No rhinorrhea.     Mouth/Throat:     Pharynx: No oropharyngeal exudate or posterior oropharyngeal erythema.  Eyes:     Conjunctiva/sclera: Conjunctivae normal.     Pupils: Pupils  are equal, round, and reactive to light.  Neck:     Musculoskeletal: Neck supple.  Cardiovascular:     Rate and Rhythm: Normal rate and regular rhythm.     Heart sounds: Normal heart sounds. No murmur.  Pulmonary:     Effort: Pulmonary effort is normal.     Breath sounds: Normal breath sounds. No wheezing, rhonchi or rales.  Abdominal:     Palpations: Abdomen is soft.     Tenderness: There is no abdominal tenderness.  Lymphadenopathy:     Cervical: No cervical adenopathy.  Skin:    General: Skin is warm and dry.     Findings: No rash.  Neurological:     Mental Status: He is alert and oriented to person, place, and time.  Psychiatric:        Behavior: Behavior normal.    Vitals:   09/08/18 1017  BP: 110/75  Pulse: 69  Resp: 14  Temp: 98.2 F (36.8 C)  TempSrc: Oral  SpO2: 98%  Weight: 156 lb 9.6 oz (71 kg)  Height: 5\' 9"  (1.753 m)      Assessment & Plan:    Bram Hottel is a 58 y.o. male Cough - Plan: benzonatate (TESSALON) 100 MG capsule, HYDROcodone-homatropine (HYCODAN) 5-1.5 MG/5ML syrup  LRTI (lower respiratory tract infection) - Plan: azithromycin (ZITHROMAX) 250 MG tablet  History of wheezing - Plan: albuterol (PROVENTIL HFA;VENTOLIN HFA) 108 (90 Base) MCG/ACT inhaler  Discolored phlegm but possible viral illness as improving, reassuring exam,  Tessalon Perles 3 times daily as needed, hydrocodone cough syrup at bedtime if needed as long as he is not wheezing or short of breath.  Albuterol prescribed if wheezing returns.  Clear on exam.  If symptoms do not continue to improve in the next week, or worsening discolored phlegm/worsening cough, can start antibiotic.  Potential side effects of meds discussed and RTC/ER precautions given  Meds ordered this encounter  Medications  . benzonatate (TESSALON) 100 MG capsule    Sig: Take 1 capsule (100 mg total) by mouth 3 (three) times daily as needed for cough.    Dispense:  20 capsule    Refill:  0  . albuterol  (PROVENTIL HFA;VENTOLIN HFA) 108 (90 Base) MCG/ACT inhaler    Sig: Inhale 1-2 puffs into the lungs every 4 (four) hours as needed for wheezing or shortness of breath.    Dispense:  1 Inhaler  Refill:  0  . HYDROcodone-homatropine (HYCODAN) 5-1.5 MG/5ML syrup    Sig: 37m by mouth a bedtime as needed for cough.    Dispense:  120 mL    Refill:  0  . azithromycin (ZITHROMAX) 250 MG tablet    Sig: Take 2 pills by mouth on day 1, then 1 pill by mouth per day on days 2 through 5.    Dispense:  6 tablet    Refill:  0   Patient Instructions    Tessalon Perles 3 times per day as needed for cough.  If any wheezing or shortness of breath, can try using the albuterol inhaler 1 to 2 puffs at a time.  If you require that more than once or twice per day, or persistently more than a few days in a row, return for recheck.    At bedtime okay to use the hydrocodone cough syrup if needed but do not use that if you are wheezing or shortness of breath (use inhaler instead).    If cough is not continuing to improve in the next week, I did prescribe an antibiotic, but would not recommend taking that at this time as you are improving.  Return to the clinic or go to the nearest emergency room if any of your symptoms worsen or new symptoms occur.   Cough, Adult  Coughing is a reflex that clears your throat and your airways. Coughing helps to heal and protect your lungs. It is normal to cough occasionally, but a cough that happens with other symptoms or lasts a long time may be a sign of a condition that needs treatment. A cough may last only 2-3 weeks (acute), or it may last longer than 8 weeks (chronic). What are the causes? Coughing is commonly caused by:  Breathing in substances that irritate your lungs.  A viral or bacterial respiratory infection.  Allergies.  Asthma.  Postnasal drip.  Smoking.  Acid backing up from the stomach into the esophagus (gastroesophageal reflux).  Certain  medicines.  Chronic lung problems, including COPD (or rarely, lung cancer).  Other medical conditions such as heart failure. Follow these instructions at home: Pay attention to any changes in your symptoms. Take these actions to help with your discomfort:  Take medicines only as told by your health care provider. ? If you were prescribed an antibiotic medicine, take it as told by your health care provider. Do not stop taking the antibiotic even if you start to feel better. ? Talk with your health care provider before you take a cough suppressant medicine.  Drink enough fluid to keep your urine clear or pale yellow.  If the air is dry, use a cold steam vaporizer or humidifier in your bedroom or your home to help loosen secretions.  Avoid anything that causes you to cough at work or at home.  If your cough is worse at night, try sleeping in a semi-upright position.  Avoid cigarette smoke. If you smoke, quit smoking. If you need help quitting, ask your health care provider.  Avoid caffeine.  Avoid alcohol.  Rest as needed. Contact a health care provider if:  You have new symptoms.  You cough up pus.  Your cough does not get better after 2-3 weeks, or your cough gets worse.  You cannot control your cough with suppressant medicines and you are losing sleep.  You develop pain that is getting worse or pain that is not controlled with pain medicines.  You have a fever.  You  have unexplained weight loss.  You have night sweats. Get help right away if:  You cough up blood.  You have difficulty breathing.  Your heartbeat is very fast. This information is not intended to replace advice given to you by your health care provider. Make sure you discuss any questions you have with your health care provider. Document Released: 01/04/2011 Document Revised: 12/14/2015 Document Reviewed: 09/14/2014 Elsevier Interactive Patient Education  [employer].    If you have lab  work done today you will be contacted with your lab results within the next 2 weeks.  If you have not heard from [place] then please contact us. The fastest way to get your results is to register for My Chart.   IF you received an x-ray today, you will receive an invoice from [facility]. Please contact [facility] at [phone] with questions or concerns regarding your invoice.   IF you received labwork today, you will receive an invoice from [facility]. Please contact [facility] at [phone] with questions or concerns regarding your invoice.   Our billing staff will not be able to assist you with questions regarding bills from these companies.  You will be contacted with the lab results as soon as they are available. The fastest way to get your results is to activate your My Chart account. Instructions are located on the last page of this paperwork. If you have not heard from [place] regarding the results in 2 weeks, please contact this office.       Signed,   Merri Ray, MD [facility].  09/08/18 11:07 AM

## 2018-09-08 NOTE — Patient Instructions (Addendum)
Tessalon Perles 3 times per day as needed for cough.  If any wheezing or shortness of breath, can try using the albuterol inhaler 1 to 2 puffs at a time.  If you require that more than once or twice per day, or persistently more than a few days in a row, return for recheck.    At bedtime okay to use the hydrocodone cough syrup if needed but do not use that if you are wheezing or shortness of breath (use inhaler instead).    If cough is not continuing to improve in the next week, I did prescribe an antibiotic, but would not recommend taking that at this time as you are improving.  Return to the clinic or go to the nearest emergency room if any of your symptoms worsen or new symptoms occur.   Cough, Adult  Coughing is a reflex that clears your throat and your airways. Coughing helps to heal and protect your lungs. It is normal to cough occasionally, but a cough that happens with other symptoms or lasts a long time may be a sign of a condition that needs treatment. A cough may last only 2-3 weeks (acute), or it may last longer than 8 weeks (chronic). What are the causes? Coughing is commonly caused by:  Breathing in substances that irritate your lungs.  A viral or bacterial respiratory infection.  Allergies.  Asthma.  Postnasal drip.  Smoking.  Acid backing up from the stomach into the esophagus (gastroesophageal reflux).  Certain medicines.  Chronic lung problems, including COPD (or rarely, lung cancer).  Other medical conditions such as heart failure. Follow these instructions at home: Pay attention to any changes in your symptoms. Take these actions to help with your discomfort:  Take medicines only as told by your health care provider. ? If you were prescribed an antibiotic medicine, take it as told by your health care provider. Do not stop taking the antibiotic even if you start to feel better. ? Talk with your health care provider before you take a cough suppressant  medicine.  Drink enough fluid to keep your urine clear or pale yellow.  If the air is dry, use a cold steam vaporizer or humidifier in your bedroom or your home to help loosen secretions.  Avoid anything that causes you to cough at work or at home.  If your cough is worse at night, try sleeping in a semi-upright position.  Avoid cigarette smoke. If you smoke, quit smoking. If you need help quitting, ask your health care provider.  Avoid caffeine.  Avoid alcohol.  Rest as needed. Contact a health care provider if:  You have new symptoms.  You cough up pus.  Your cough does not get better after 2-3 weeks, or your cough gets worse.  You cannot control your cough with suppressant medicines and you are losing sleep.  You develop pain that is getting worse or pain that is not controlled with pain medicines.  You have a fever.  You have unexplained weight loss.  You have night sweats. Get help right away if:  You cough up blood.  You have difficulty breathing.  Your heartbeat is very fast. This information is not intended to replace advice given to you by your health care provider. Make sure you discuss any questions you have with your health care provider. Document Released: 01/04/2011 Document Revised: 12/14/2015 Document Reviewed: 09/14/2014 Elsevier Interactive Patient Education  [employer].    If you have lab work done today you  will be contacted with your lab results within the next 2 weeks.  If you have not heard from [place] then please contact us. The fastest way to get your results is to register for My Chart.   IF you received an x-ray today, you will receive an invoice from [facility]. Please contact [facility] at [phone] with questions or concerns regarding your invoice.   IF you received labwork today, you will receive an invoice from [facility]. Please contact [facility] at [phone] with questions or concerns regarding your  invoice.   Our billing staff will not be able to assist you with questions regarding bills from these companies.  You will be contacted with the lab results as soon as they are available. The fastest way to get your results is to activate your My Chart account. Instructions are located on the last page of this paperwork. If you have not heard from [place] regarding the results in 2 weeks, please contact this office.

## 2018-09-28 ENCOUNTER — Ambulatory Visit: Payer: BLUE CROSS/BLUE SHIELD | Admitting: Emergency Medicine

## 2019-11-01 ENCOUNTER — Emergency Department (HOSPITAL_COMMUNITY)
Admission: EM | Admit: 2019-11-01 | Discharge: 2019-11-01 | Disposition: A | Discharge status: Home / Self Care | Payer: BLUE CROSS/BLUE SHIELD | Attending: Emergency Medicine | Admitting: Emergency Medicine

## 2019-11-01 ENCOUNTER — Emergency Department (HOSPITAL_COMMUNITY): Payer: BLUE CROSS/BLUE SHIELD

## 2019-11-01 ENCOUNTER — Other Ambulatory Visit: Payer: Self-pay

## 2019-11-01 ENCOUNTER — Encounter (HOSPITAL_COMMUNITY): Payer: Self-pay | Admitting: Emergency Medicine

## 2019-11-01 DIAGNOSIS — Z87891 Personal history of nicotine dependence: Secondary | ICD-10-CM | POA: Diagnosis not present

## 2019-11-01 DIAGNOSIS — R079 Chest pain, unspecified: Secondary | ICD-10-CM | POA: Insufficient documentation

## 2019-11-01 LAB — BASIC METABOLIC PANEL
Anion gap: 11 (ref 5–15)
BUN: 15 mg/dL (ref 6–20)
CO2: 24 mmol/L (ref 22–32)
Calcium: 8.9 mg/dL (ref 8.9–10.3)
Chloride: 101 mmol/L (ref 98–111)
Creatinine, Ser: 0.78 mg/dL (ref 0.61–1.24)
GFR calc Af Amer: >60 mL/min (ref >60)
GFR calc non Af Amer: >60 mL/min (ref >60)
Glucose, Bld: 84 mg/dL (ref 70–99)
Potassium: 4.1 mmol/L (ref 3.5–5.1)
Sodium: 136 mmol/L (ref 135–145)

## 2019-11-01 LAB — CBC
HCT: 45.1 % (ref 39.0–52.0)
Hemoglobin: 14.6 g/dL (ref 13.0–17.0)
MCH: 28.3 pg (ref 26.0–34.0)
MCHC: 32.4 g/dL (ref 30.0–36.0)
MCV: 87.4 fL (ref 80.0–100.0)
Platelets: 213 10*3/uL (ref 150–400)
RBC: 5.16 MIL/uL (ref 4.22–5.81)
RDW: 11.9 % (ref 11.5–15.5)
WBC: 4.5 10*3/uL (ref 4.0–10.5)
nRBC: 0 % (ref 0.0–0.2)

## 2019-11-01 LAB — TROPONIN I (HIGH SENSITIVITY)
Troponin I (High Sensitivity): <2 ng/L (ref <18)
Troponin I (High Sensitivity): <2 ng/L (ref <18)

## 2019-11-01 NOTE — ED Provider Notes (Signed)
[facility] DEPT Provider Note   CSN: [id number] Arrival date & time: 11/01/19  1004     History Chief Complaint  Patient presents with  . Chest Pain    Jay Osborn is a 59 y.o. male.  The history is provided by the patient.  Chest Pain Pain location:  Substernal area Pain quality: aching   Pain radiates to:  Does not radiate Pain severity:  Mild Onset quality:  Gradual Progression:  Resolved Chronicity:  New Context: at rest   Relieved by:  Nothing Worsened by:  Nothing Associated symptoms: no abdominal pain, no back pain, no cough, no fever, no palpitations, no shortness of breath and no vomiting   Risk factors: no coronary artery disease, no diabetes mellitus, no high cholesterol, no hypertension, no prior DVT/PE and no smoking        Past Medical History:  Diagnosis Date  . Allergy   . Arthritis     Patient Active Problem List   Diagnosis Date Noted  . Aortic atherosclerosis ([place]) 01/19/2017  . Chest pain with low risk for cardiac etiology 01/19/2017  . Poison ivy dermatitis 10/24/2016  . Itching 10/24/2016  . Acute appendicitis 10/18/2015    Past Surgical History:  Procedure Laterality Date  . APPENDECTOMY    . LAPAROSCOPIC APPENDECTOMY N/A 10/18/2015   Procedure: APPENDECTOMY LAPAROSCOPIC;  Surgeon: Johnathan Hausen, MD;  Location: [facility];  Service: General;  Laterality: N/A;  . NECK SURGERY    . SPINE SURGERY         Family History  Problem Relation Age of Onset  . Cancer Father        prostate  . Heart attack Mother 53  . Sudden death Cousin     Social History   Tobacco Use  . Smoking status: Former Smoker    Quit date: 03/18/2013    Years since quitting: 6.6  . Smokeless tobacco: Former Systems developer    Types: Chew  Substance Use Topics  . Alcohol use: No    Alcohol/week: 0.0 standard drinks  . Drug use: No    Home Medications Prior to Admission medications   Medication Sig Start Date End Date Taking?  Authorizing Provider  clobetasol cream (TEMOVATE) [id number] % Apply 1 application topically daily. 09/15/19  Yes [provider]  triamcinolone ointment (KENALOG) 0.1 % Apply 1 application topically daily. 06/27/19  Yes [provider]  VITAMIN D PO Take 1 capsule by mouth daily.   Yes [provider]  albuterol (PROVENTIL HFA;VENTOLIN HFA) 108 (90 Base) MCG/ACT inhaler Inhale 1-2 puffs into the lungs every 4 (four) hours as needed for wheezing or shortness of breath. Patient not taking: Reported on 11/01/2019 09/08/18   Wendie Agreste, MD  azithromycin (ZITHROMAX) 250 MG tablet Take 2 pills by mouth on day 1, then 1 pill by mouth per day on days 2 through 5. Patient not taking: Reported on 11/01/2019 09/08/18   Wendie Agreste, MD  benzonatate (TESSALON) 100 MG capsule Take 1 capsule (100 mg total) by mouth 3 (three) times daily as needed for cough. Patient not taking: Reported on 11/01/2019 09/08/18   Wendie Agreste, MD  HYDROcodone-homatropine [facility]) 5-1.5 MG/5ML syrup 24m by mouth a bedtime as needed for cough. Patient not taking: Reported on 11/01/2019 09/08/18   Wendie Agreste, MD    Allergies    Patient has no known allergies.  Review of Systems   Review of Systems  Constitutional: Negative for chills and fever.  HENT: Negative for ear pain and sore throat.   Eyes: Negative for pain and visual disturbance.  Respiratory: Negative for cough and shortness of breath.   Cardiovascular: Positive for chest pain. Negative for palpitations.  Gastrointestinal: Negative for abdominal pain and vomiting.  Genitourinary: Negative for dysuria and hematuria.  Musculoskeletal: Negative for arthralgias and back pain.  Skin: Negative for color change and rash.  Neurological: Negative for seizures and syncope.  All other systems reviewed and are negative.   Physical Exam Updated Vital Signs  ED Triage Vitals  Enc Vitals Group     BP 11/01/19 1011 113/78     Pulse  Rate 11/01/19 1011 (!) 110     Resp 11/01/19 1011 16     Temp 11/01/19 1011 97.8 F (36.6 C)     Temp Source 11/01/19 1011 Oral     SpO2 11/01/19 1011 100 %     Weight 11/01/19 1013 152 lb (68.9 kg)     Height 11/01/19 1013 5\' 9"  (1.753 m)     Head Circumference --      Peak Flow --      Pain Score 11/01/19 1019 6     Pain Loc --      Pain Edu? --      Excl. in [place]? --     Physical Exam Vitals and nursing note reviewed.  Constitutional:      Appearance: He is well-developed.  HENT:     Head: Normocephalic and atraumatic.  Eyes:     Conjunctiva/sclera: Conjunctivae normal.     Pupils: Pupils are equal, round, and reactive to light.  Cardiovascular:     Rate and Rhythm: Normal rate and regular rhythm.     Pulses:          Radial pulses are 2+ on the right side and 2+ on the left side.     Heart sounds: Normal heart sounds. No murmur.  Pulmonary:     Effort: Pulmonary effort is normal. No respiratory distress.     Breath sounds: Normal breath sounds. No decreased breath sounds, wheezing or rhonchi.  Abdominal:     Palpations: Abdomen is soft.     Tenderness: There is no abdominal tenderness.  Musculoskeletal:        General: Normal range of motion.     Cervical back: Normal range of motion and neck supple.     Right lower leg: No edema.     Left lower leg: No edema.  Skin:    General: Skin is warm and dry.     Capillary Refill: Capillary refill takes less than 2 seconds.  Neurological:     General: No focal deficit present.     Mental Status: He is alert.  Psychiatric:        Mood and Affect: Mood normal.     ED Results / Procedures / Treatments   Labs (all labs ordered are listed, but only abnormal results are displayed) Labs Reviewed  BASIC METABOLIC PANEL  CBC  TROPONIN I (HIGH SENSITIVITY)  TROPONIN I (HIGH SENSITIVITY)    EKG EKG Interpretation  Date/Time:  Monday November 01 2019 12:23:22 EDT Ventricular Rate:  56 PR Interval:    QRS Duration: 97 QT  Interval:  412 QTC Calculation: 398 R Axis:   11 Text Interpretation: Sinus rhythm Minimal ST elevation, anterior leads but appear similar to prior, no reciprocal changes Confirmed by Lennice Sites [phone]) on 11/01/2019 12:31:37 PM   Radiology DG Chest 2 View  Result Date: 11/01/2019 CLINICAL DATA:  Chest pain EXAM: CHEST - 2 VIEW COMPARISON:  01/03/2017 FINDINGS: There is no consolidation or edema. Suspected mild changes of COPD. No pleural effusion or pneumothorax. Cardiomediastinal contours are within normal limits with normal heart size. Partially imaged anterior cervical spine fusion. IMPRESSION: No acute process in the chest.  Suspected mild changes of COPD. Electronically Signed   By: Macy Mis M.D.   On: 11/01/2019 10:34    Procedures Procedures (including critical care time)  Medications Ordered in ED Medications - No data to display  ED Course  I have reviewed the triage vital signs and the nursing notes.  Pertinent labs & imaging results that were available during my care of the patient were reviewed by me and considered in my medical decision making (see chart for details).    MDM Rules/Calculators/A&P                      Jay Osborn is a 59 year old male with no significant medical history presents to the ED with chest pain.  Patient with overall unremarkable vitals.  No fever.  No PE risk factors.  Wells criteria 0 and doubt PE.  EKG shows sinus rhythm.  No ischemic changes.  Patient with no significant anemia, electrolyte abnormality, kidney injury.  Chest x-ray shows no signs of infection.  No pneumothorax.  Troponin negative x2.  Overall low risk for ACS.  Possibly muscle related pain or reflux related pain.  Given return precautions.  Recommend follow-up with primary care doctor and discharged in the ED in good condition.  This chart was dictated using voice recognition software.  Despite best efforts to proofread,  errors can occur which can change the  documentation meaning.    Final Clinical Impression(s) / ED Diagnoses Final diagnoses:  Nonspecific chest pain    Rx / DC Orders ED Discharge Orders    None       Lennice Sites, DO 11/01/19 1356

## 2019-11-01 NOTE — ED Triage Notes (Signed)
Pt reports that while he was at work sitting at desk working on computer started having chest pains that radiate to back that were intermittent. Started around 3 hours ago.

## 2020-08-29 ENCOUNTER — Encounter: Payer: Self-pay | Admitting: Registered Nurse

## 2020-08-29 ENCOUNTER — Other Ambulatory Visit: Payer: Self-pay

## 2020-08-29 ENCOUNTER — Ambulatory Visit (INDEPENDENT_AMBULATORY_CARE_PROVIDER_SITE_OTHER): Payer: 59 | Admitting: Registered Nurse

## 2020-08-29 VITALS — BP 121/72 | HR 68 | Temp 98.0°F | Resp 18 | Ht 69.0 in | Wt 147.6 lb

## 2020-08-29 DIAGNOSIS — M25561 Pain in right knee: Secondary | ICD-10-CM | POA: Diagnosis not present

## 2020-08-29 MED ORDER — DICLOFENAC SODIUM 75 MG PO TBEC: 75.0000 mg | DELAYED_RELEASE_TABLET | Freq: Two times a day (BID) | ORAL | 0 refills | Status: AC

## 2020-08-29 NOTE — Patient Instructions (Signed)
° ° ° °  If you have lab work done today you will be contacted with your lab results within the next 2 weeks.  If you have not heard from us then please contact us. The fastest way to get your results is to register for My Chart. ° ° °IF you received an x-ray today, you will receive an invoice from [facility]. Please contact [facility] at [phone] with questions or concerns regarding your invoice.  ° °IF you received labwork today, you will receive an invoice from [facility]. Please contact [facility] at [phone] with questions or concerns regarding your invoice.  ° °Our billing staff will not be able to assist you with questions regarding bills from these companies. ° °You will be contacted with the lab results as soon as they are available. The fastest way to get your results is to activate your My Chart account. Instructions are located on the last page of this paperwork. If you have not heard from us regarding the results in 2 weeks, please contact this office. °  ° ° ° °

## 2020-10-30 ENCOUNTER — Other Ambulatory Visit: Payer: Self-pay

## 2020-10-30 ENCOUNTER — Encounter (HOSPITAL_COMMUNITY): Payer: Self-pay

## 2020-10-30 ENCOUNTER — Ambulatory Visit (HOSPITAL_COMMUNITY)
Admission: EM | Admit: 2020-10-30 | Discharge: 2020-10-30 | Disposition: A | Discharge status: Home / Self Care | Payer: 59 | Attending: Physician Assistant | Admitting: Physician Assistant

## 2020-10-30 DIAGNOSIS — R21 Rash and other nonspecific skin eruption: Secondary | ICD-10-CM

## 2020-10-30 DIAGNOSIS — M5416 Radiculopathy, lumbar region: Secondary | ICD-10-CM | POA: Diagnosis not present

## 2020-10-30 HISTORY — DX: Systemic involvement of connective tissue, unspecified: M35.9

## 2020-10-30 MED ORDER — CLOBETASOL PROPIONATE 0.05 % EX CREA: 1.0000 "application " | TOPICAL_CREAM | Freq: Every day | CUTANEOUS | 2 refills | Status: DC

## 2020-10-30 MED ORDER — GABAPENTIN 100 MG PO CAPS: 100.0000 mg | ORAL_CAPSULE | Freq: Two times a day (BID) | ORAL | 0 refills | Status: AC

## 2020-10-30 NOTE — Discharge Instructions (Addendum)
Take medications as prescribed Follow up with primary care physician.  Someone should be contacting you to help you find a primary care physician

## 2020-10-30 NOTE — ED Provider Notes (Signed)
[place]    CSN: [id number] Arrival date & time: 10/30/20  1657      History   Chief Complaint Chief Complaint  Patient presents with  . Rash    HPI Jay Osborn is a 60 y.o. male.   Pt complains of a diffuse rash, history of Asteatotic eczema. He typically uses clobetasol with relief, but has run out. Pt request refill of cream today.    Pt also describes a patch of itchiness, tingling to his lateral left thigh.  He reports scratching the area provides no relief. No rash noted to this area. He also complains of intermittent shooting pain from buttocks down the leg.  This occurs occasionally while seated. He reports minimal lower back pain.   Pt recently switched insurances and is having trouble finding a PCP.       Past Medical History:  Diagnosis Date  . Allergy   . Arthritis   . Autoimmune disease ([place])    pt reports being told has autoimmune disease, unsure of name    Patient Active Problem List   Diagnosis Date Noted  . Aortic atherosclerosis ([place]) 01/19/2017  . Chest pain with low risk for cardiac etiology 01/19/2017  . Poison ivy dermatitis 10/24/2016  . Itching 10/24/2016  . Acute appendicitis 10/18/2015    Past Surgical History:  Procedure Laterality Date  . APPENDECTOMY    . LAPAROSCOPIC APPENDECTOMY N/A 10/18/2015   Procedure: APPENDECTOMY LAPAROSCOPIC;  Surgeon: Johnathan Hausen, MD;  Location: [facility];  Service: General;  Laterality: N/A;  . NECK SURGERY    . SPINE SURGERY         Home Medications    Prior to Admission medications   Medication Sig Start Date End Date Taking? Authorizing Provider  gabapentin (NEURONTIN) 100 MG capsule Take 1 capsule (100 mg total) by mouth 2 (two) times daily. 10/30/20  Yes Terecia Plaut, PA-C  VITAMIN D PO Take 1 capsule by mouth daily.   Yes [provider]  albuterol (PROVENTIL HFA;VENTOLIN HFA) 108 (90 Base) MCG/ACT inhaler Inhale 1-2 puffs into the lungs every 4 (four) hours as  needed for wheezing or shortness of breath. Patient not taking: No sig reported 09/08/18   Wendie Agreste, MD  azithromycin (ZITHROMAX) 250 MG tablet Take 2 pills by mouth on day 1, then 1 pill by mouth per day on days 2 through 5. Patient not taking: No sig reported 09/08/18   Wendie Agreste, MD  benzonatate (TESSALON) 100 MG capsule Take 1 capsule (100 mg total) by mouth 3 (three) times daily as needed for cough. Patient not taking: No sig reported 09/08/18   Wendie Agreste, MD  clobetasol cream (TEMOVATE) 2.59 % Apply 1 application topically daily. 10/30/20   Konrad Felix, PA-C  diclofenac (VOLTAREN) 75 MG EC tablet Take 1 tablet (75 mg total) by mouth 2 (two) times daily. 08/29/20   Maximiano Coss, NP  HYDROcodone-homatropine [facility]) 5-1.5 MG/5ML syrup 54m by mouth a bedtime as needed for cough. Patient not taking: No sig reported 09/08/18   Wendie Agreste, MD  triamcinolone ointment (KENALOG) 0.1 % Apply 1 application topically daily. 06/27/19   [provider]    Family History Family History  Problem Relation Age of Onset  . Cancer Father        prostate  . Heart attack Mother 46  . Sudden death Cousin     Social History Social History   Tobacco Use  . Smoking status: Former Smoker  Quit date: 03/18/2013    Years since quitting: 7.6  . Smokeless tobacco: Former Systems developer    Types: Chew  Substance Use Topics  . Alcohol use: No    Alcohol/week: 0.0 standard drinks  . Drug use: No     Allergies   Patient has no known allergies.   Review of Systems Review of Systems  Constitutional: Negative for chills and fever.  HENT: Negative for ear pain and sore throat.   Eyes: Negative for pain and visual disturbance.  Respiratory: Negative for cough and shortness of breath.   Cardiovascular: Negative for chest pain and palpitations.  Gastrointestinal: Negative for abdominal pain and vomiting.  Genitourinary: Negative for dysuria and hematuria.   Musculoskeletal: Negative for arthralgias and back pain.  Skin: Positive for rash. Negative for color change.  Neurological: Negative for seizures and syncope.  All other systems reviewed and are negative.    Physical Exam Triage Vital Signs ED Triage Vitals  Enc Vitals Group     BP 10/30/20 1739 124/79     Pulse Rate 10/30/20 1739 66     Resp 10/30/20 1739 20     Temp 10/30/20 1739 98 F (36.7 C)     Temp src --      SpO2 10/30/20 1739 98 %     Weight --      Height --      Head Circumference --      Peak Flow --      Pain Score 10/30/20 1738 0     Pain Loc --      Pain Edu? --      Excl. in [place]? --    No data found.  Updated Vital Signs BP 124/79 (BP Location: Right Arm)   Pulse 66   Temp 98 F (36.7 C)   Resp 20   SpO2 98%   Visual Acuity Right Eye Distance:   Left Eye Distance:   Bilateral Distance:    Right Eye Near:   Left Eye Near:    Bilateral Near:     Physical Exam Vitals and nursing note reviewed.  Constitutional:      Appearance: He is well-developed.  HENT:     Head: Normocephalic and atraumatic.  Eyes:     Conjunctiva/sclera: Conjunctivae normal.  Cardiovascular:     Rate and Rhythm: Normal rate and regular rhythm.     Heart sounds: No murmur heard.   Pulmonary:     Effort: Pulmonary effort is normal. No respiratory distress.     Breath sounds: Normal breath sounds.  Abdominal:     Palpations: Abdomen is soft.     Tenderness: There is no abdominal tenderness.  Musculoskeletal:     Cervical back: Neck supple.  Skin:    General: Skin is warm and dry.     Comments: Diffuse maculopapular rash  Neurological:     Mental Status: He is alert.      UC Treatments / Results  Labs (all labs ordered are listed, but only abnormal results are displayed) Labs Reviewed - No data to display  EKG   Radiology No results found.  Procedures Procedures (including critical care time)  Medications Ordered in UC Medications - No data to  display  Initial Impression / Assessment and Plan / UC Course  I have reviewed the triage vital signs and the nursing notes.  Pertinent labs & imaging results that were available during my care of the patient were reviewed by me and considered in my  medical decision making (see chart for details).     Clobetasol refilled today. He reports this provides good relief.   Patch of itchiness and tingling to outer thigh possibly nerve irritation, he does endorse periodic sciatica.  Gabapentin prescribed today, instructions given.   PCP assistance requested.  Final Clinical Impressions(s) / UC Diagnoses   Final diagnoses:  Rash  Lumbar radiculopathy     Discharge Instructions     Take medications as prescribed Follow up with primary care physician.  Someone should be contacting you to help you find a primary care physician   ED Prescriptions    Medication Sig Dispense Auth. Provider   clobetasol cream (TEMOVATE) 8.92 % Apply 1 application topically daily. 30 g Nobie Alleyne, PA-C   gabapentin (NEURONTIN) 100 MG capsule Take 1 capsule (100 mg total) by mouth 2 (two) times daily. 60 capsule Konrad Felix, PA-C     PDMP not reviewed this encounter.   Konrad Felix, PA-C 10/30/20 1810

## 2020-10-30 NOTE — ED Triage Notes (Signed)
Pt with diffuse bumpy, itchy rash all over body, which pt states is related to his autoimmune disease. Pt had used Clobetasol Propionate cream with relief but ran out of this and would like refill. States has been having trouble finding primary care.

## 2020-11-22 ENCOUNTER — Ambulatory Visit: Payer: 59 | Admitting: Internal Medicine

## 2021-01-16 ENCOUNTER — Other Ambulatory Visit: Payer: Self-pay

## 2021-01-16 ENCOUNTER — Encounter: Payer: Self-pay | Admitting: Internal Medicine

## 2021-01-16 ENCOUNTER — Ambulatory Visit (INDEPENDENT_AMBULATORY_CARE_PROVIDER_SITE_OTHER): Payer: 59 | Admitting: Internal Medicine

## 2021-01-16 VITALS — BP 106/60 | HR 67 | Temp 98.1°F | Ht 69.0 in | Wt 147.0 lb

## 2021-01-16 DIAGNOSIS — Z0001 Encounter for general adult medical examination with abnormal findings: Secondary | ICD-10-CM

## 2021-01-16 DIAGNOSIS — I700 Atherosclerosis of aorta: Secondary | ICD-10-CM

## 2021-01-16 DIAGNOSIS — L6611 Classic lichen planopilaris: Secondary | ICD-10-CM | POA: Insufficient documentation

## 2021-01-16 DIAGNOSIS — L661 Lichen planopilaris: Secondary | ICD-10-CM

## 2021-01-16 DIAGNOSIS — Z1211 Encounter for screening for malignant neoplasm of colon: Secondary | ICD-10-CM

## 2021-01-16 LAB — LIPID PANEL
Cholesterol: 153 mg/dL (ref 0–200)
HDL: 47.2 mg/dL (ref >39.00)
LDL Cholesterol: 93 mg/dL (ref 0–99)
NonHDL: 105.62
Total CHOL/HDL Ratio: 3
Triglycerides: 64 mg/dL (ref 0.0–149.0)
VLDL: 12.8 mg/dL (ref 0.0–40.0)

## 2021-01-16 LAB — PSA: PSA: 1.62 ng/mL (ref 0.10–4.00)

## 2021-01-16 MED ORDER — CLOBETASOL PROPIONATE 0.05 % EX CREA: 1.0000 "application " | TOPICAL_CREAM | Freq: Two times a day (BID) | CUTANEOUS | 2 refills | Status: AC

## 2021-01-16 NOTE — Progress Notes (Addendum)
Subjective:  Patient ID: Jay Osborn, male    DOB: Apr 13, 1961  Age: 60 y.o. MRN: [id number]  CC: Rash and Annual Exam  This visit occurred during the SARS-CoV-2 public health emergency.  Safety protocols were in place, including screening questions prior to the visit, additional usage of staff PPE, and extensive cleaning of exam room while observing appropriate contact time as indicated for disinfecting solutions.    HPI Jay Osborn presents for a CPX and f/up -   He has a chronic pruritic rash on his extremities that has been diagnosed by derm as eczema and LP. He has not gotten much symptom relief with TAC and requests an RX for clobetasol.  History Jay Osborn has a past medical history of Allergy, Arthritis, and Autoimmune disease ([place]).   He has a past surgical history that includes Neck surgery; laparoscopic appendectomy (N/A, 10/18/2015); Appendectomy; and Spine surgery.   His family history includes Cancer in his father; Heart attack (age of onset: 66) in his mother; Sudden death in his cousin.He reports that he has quit smoking. He has quit using smokeless tobacco.  His smokeless tobacco use included chew. He reports that he does not drink alcohol and does not use drugs.  Outpatient Medications Prior to Visit  Medication Sig Dispense Refill   albuterol (PROVENTIL HFA;VENTOLIN HFA) 108 (90 Base) MCG/ACT inhaler Inhale 1-2 puffs into the lungs every 4 (four) hours as needed for wheezing or shortness of breath. 1 Inhaler 0   diclofenac (VOLTAREN) 75 MG EC tablet Take 1 tablet (75 mg total) by mouth 2 (two) times daily. 30 tablet 0   gabapentin (NEURONTIN) 100 MG capsule Take 1 capsule (100 mg total) by mouth 2 (two) times daily. 60 capsule 0   VITAMIN D PO Take 1 capsule by mouth daily.     azithromycin (ZITHROMAX) 250 MG tablet Take 2 pills by mouth on day 1, then 1 pill by mouth per day on days 2 through 5. 6 tablet 0   benzonatate (TESSALON) 100 MG capsule Take 1 capsule  (100 mg total) by mouth 3 (three) times daily as needed for cough. 20 capsule 0   clobetasol cream (TEMOVATE) 9.52 % Apply 1 application topically daily. 30 g 2   HYDROcodone-homatropine (HYCODAN) 5-1.5 MG/5ML syrup 53m by mouth a bedtime as needed for cough. 120 mL 0   triamcinolone ointment (KENALOG) 0.1 % Apply 1 application topically daily.     No facility-administered medications prior to visit.    ROS Review of Systems  Constitutional:  Negative for diaphoresis and fatigue.  HENT: Negative.    Eyes: Negative.   Respiratory:  Negative for cough, shortness of breath and wheezing.   Cardiovascular:  Negative for chest pain, palpitations and leg swelling.  Gastrointestinal:  Negative for abdominal pain, constipation, diarrhea, nausea and vomiting.  Genitourinary: Negative.  Negative for difficulty urinating, scrotal swelling and testicular pain.  Musculoskeletal:  Negative for arthralgias and myalgias.  Skin:  Positive for color change and rash.  Neurological: Negative.   Hematological: Negative.   Psychiatric/Behavioral: Negative.     Objective:  BP 106/60 (BP Location: Left Arm, Patient Position: Sitting, Cuff Size: Large)   Pulse 67   Temp 98.1 F (36.7 C) (Oral)   Ht 5\' 9"  (1.753 m)   Wt 147 lb (66.7 kg)   SpO2 97%   BMI 21.71 kg/m   Physical Exam Vitals reviewed.  Constitutional:      Appearance: Normal appearance.  HENT:     Nose: Nose  normal.     Mouth/Throat:     Mouth: Mucous membranes are moist.  Eyes:     General: No scleral icterus.    Conjunctiva/sclera: Conjunctivae normal.  Cardiovascular:     Rate and Rhythm: Normal rate and regular rhythm.     Heart sounds: No murmur heard. Pulmonary:     Effort: Pulmonary effort is normal.     Breath sounds: No stridor. No wheezing, rhonchi or rales.  Abdominal:     General: Abdomen is flat.     Palpations: There is no mass.     Tenderness: There is no abdominal tenderness. There is no guarding.     Hernia: No  hernia is present. There is no hernia in the left inguinal area or right inguinal area.  Genitourinary:    Pubic Area: No rash.      Penis: Normal and circumcised.      Testes: Normal.     Epididymis:     Right: Normal.     Left: Normal.     Prostate: Normal. Not enlarged, not tender and no nodules present.     Rectum: Normal. Guaiac result negative. No mass, tenderness, anal fissure, external hemorrhoid or internal hemorrhoid. Normal anal tone.  Musculoskeletal:        General: Normal range of motion.     Cervical back: Neck supple.  Lymphadenopathy:     Cervical: No cervical adenopathy.     Lower Body: No right inguinal adenopathy. No left inguinal adenopathy.  Skin:    General: Skin is warm and dry.     Findings: Rash present. Rash is papular.     Comments: There are hyperpigmented macules/papules over the proximal dorsal forearms  Neurological:     General: No focal deficit present.     Mental Status: He is alert.  Psychiatric:        Mood and Affect: Mood normal.    Lab Results  Component Value Date   WBC 4.5 11/01/2019   HGB 14.6 11/01/2019   HCT 45.1 11/01/2019   PLT 213 11/01/2019   GLUCOSE 84 11/01/2019   CHOL 153 01/16/2021   TRIG 64.0 01/16/2021   HDL 47.20 01/16/2021   LDLCALC 93 01/16/2021   ALT 10 07/10/2017   AST 17 07/10/2017   NA 136 11/01/2019   K 4.1 11/01/2019   CL 101 11/01/2019   CREATININE 0.78 11/01/2019   BUN 15 11/01/2019   CO2 24 11/01/2019   TSH 2.470 07/10/2017   PSA 1.62 01/16/2021     Assessment & Plan:   Jay Osborn was seen today for rash and annual exam.  Diagnoses and all orders for this visit:  Encounter for general adult medical examination with abnormal findings- Exam completed, labs reviewed, cancer screenings addressed, vaccines are UTD, pt ed material was given. -     Lipid panel; Future -     PSA; Future -     PSA -     Lipid panel  Colon cancer screening -     Cologuard  Aortic atherosclerosis ([place])- He has a low  ASCVD risk score.  Lichen planus follicularis -     clobetasol cream (TEMOVATE) 0.05 %; Apply 1 application topically 2 (two) times daily.  I have discontinued Jay Osborn's benzonatate, HYDROcodone-homatropine, azithromycin, and triamcinolone ointment. I have also changed his clobetasol cream. Additionally, I am having him maintain his albuterol, VITAMIN D PO, diclofenac, and gabapentin.  Meds ordered this encounter  Medications   clobetasol cream (TEMOVATE) 0.05 %  Sig: Apply 1 application topically 2 (two) times daily.    Dispense:  60 g    Refill:  2      Follow-up: No follow-ups on file.  Scarlette Calico, MD

## 2021-01-17 ENCOUNTER — Encounter: Payer: Self-pay | Admitting: Internal Medicine

## 2021-02-03 LAB — COLOGUARD: Cologuard: NEGATIVE

## 2021-02-03 NOTE — Addendum Note (Signed)
Addended by: Janith Lima on: 02/03/2021 11:02 AM   Modules accepted: Orders

## 2021-03-13 ENCOUNTER — Other Ambulatory Visit: Payer: Self-pay | Admitting: Home Modifications

## 2021-03-13 ENCOUNTER — Other Ambulatory Visit: Payer: Self-pay

## 2021-03-13 ENCOUNTER — Ambulatory Visit
Admission: RE | Admit: 2021-03-13 | Discharge: 2021-03-13 | Disposition: A | Discharge status: Home / Self Care | Payer: 59 | Source: Ambulatory Visit | Attending: Home Modifications | Admitting: Home Modifications

## 2021-03-13 DIAGNOSIS — R52 Pain, unspecified: Secondary | ICD-10-CM

## 2021-03-13 DIAGNOSIS — R609 Edema, unspecified: Secondary | ICD-10-CM

## 2021-04-03 ENCOUNTER — Ambulatory Visit: Payer: 59 | Admitting: Internal Medicine

## 2021-08-13 NOTE — Progress Notes (Signed)
Established Patient Office Visit  Subjective:  Patient ID: Jay Osborn, male    DOB: Feb 12, 1961  Age: 61 y.o. MRN: [id number]  CC:  Chief Complaint  Patient presents with   Leg Pain    Patient states he is having some right leg pain for about a week. Pain start from the knee to his foot.     HPI [name] presents for r leg pain Acute onset 1 week ago without apparent injury or trauma Starts at knee and radiates down towards foot No swelling or redness No instability. Has not happened before.  Otherwise no acute concerns.   Past Medical History:  Diagnosis Date   Allergy    Arthritis    Autoimmune disease ([place])    pt reports being told has autoimmune disease, unsure of name    Past Surgical History:  Procedure Laterality Date   APPENDECTOMY     LAPAROSCOPIC APPENDECTOMY N/A 10/18/2015   Procedure: APPENDECTOMY LAPAROSCOPIC;  Surgeon: Johnathan Hausen, MD;  Location: [facility];  Service: General;  Laterality: N/A;   NECK SURGERY     SPINE SURGERY      Family History  Problem Relation Age of Onset   Heart attack Mother 58   Cancer Father        prostate   Sudden death Cousin     Social History   Socioeconomic History   Marital status: Married    Spouse name: Not on file   Number of children: 4   Years of education: 16   Highest education level: Not on file  Occupational History    Employer: [employer]    Comment: [employer]   Tobacco Use   Smoking status: Former   Smokeless tobacco: Former    Types: Chew  Substance and Sexual Activity   Alcohol use: No    Alcohol/week: 0.0 standard drinks   Drug use: No   Sexual activity: Yes    Birth control/protection: None  Other Topics Concern   Not on file  Social History Narrative   He is a native of the [place]. He is married for 25 years with 4 children. He lives with his wife and 4 children.   He works here locally in [place] having graduated from [employer].   Does not do  routine exercise, but does walk all the time at work and is quite busy being very active.   Social Determinants of Health   Financial Resource Strain: Not on file  Food Insecurity: Not on file  Transportation Needs: Not on file  Physical Activity: Not on file  Stress: Not on file  Social Connections: Not on file  Intimate Partner Violence: Not on file    Outpatient Medications Prior to Visit  Medication Sig Dispense Refill   VITAMIN D PO Take 1 capsule by mouth daily.     triamcinolone ointment (KENALOG) 0.1 % Apply 1 application topically daily.     albuterol (PROVENTIL HFA;VENTOLIN HFA) 108 (90 Base) MCG/ACT inhaler Inhale 1-2 puffs into the lungs every 4 (four) hours as needed for wheezing or shortness of breath. 1 Inhaler 0   azithromycin (ZITHROMAX) 250 MG tablet Take 2 pills by mouth on day 1, then 1 pill by mouth per day on days 2 through 5. 6 tablet 0   benzonatate (TESSALON) 100 MG capsule Take 1 capsule (100 mg total) by mouth 3 (three) times daily as needed for cough. 20 capsule 0   clobetasol cream (TEMOVATE) 0.05 % Apply 1  application topically daily. (Patient not taking: No sig reported)     HYDROcodone-homatropine (HYCODAN) 5-1.5 MG/5ML syrup 89m by mouth a bedtime as needed for cough. 120 mL 0   No facility-administered medications prior to visit.    No Known Allergies  ROS Review of Systems  Constitutional: Negative.   HENT: Negative.    Eyes: Negative.   Respiratory: Negative.    Cardiovascular: Negative.   Gastrointestinal: Negative.   Genitourinary: Negative.   Musculoskeletal: Negative.   Skin: Negative.   Neurological: Negative.   Psychiatric/Behavioral: Negative.    All other systems reviewed and are negative.    Objective:    Physical Exam Constitutional:      General: He is not in acute distress.    Appearance: Normal appearance. He is normal weight. He is not ill-appearing, toxic-appearing or diaphoretic.  Cardiovascular:     Rate and  Rhythm: Normal rate and regular rhythm.     Heart sounds: Normal heart sounds. No murmur heard.   No friction rub. No gallop.  Pulmonary:     Effort: Pulmonary effort is normal. No respiratory distress.     Breath sounds: Normal breath sounds. No stridor. No wheezing, rhonchi or rales.  Chest:     Chest wall: No tenderness.  Musculoskeletal:        General: Tenderness (mild, joint line R knee) present. No swelling, deformity or signs of injury. Normal range of motion.     Right lower leg: No edema.     Left lower leg: No edema.  Neurological:     General: No focal deficit present.     Mental Status: He is alert and oriented to person, place, and time. Mental status is at baseline.  Psychiatric:        Mood and Affect: Mood normal.        Behavior: Behavior normal.        Thought Content: Thought content normal.        Judgment: Judgment normal.    BP 121/72    Pulse 68    Temp 98 F (36.7 C) (Temporal)    Resp 18    Ht 5\' 9"  (1.753 m)    Wt 147 lb 9.6 oz (67 kg)    SpO2 100%    BMI 21.80 kg/m  Wt Readings from Last 3 Encounters:  01/16/21 147 lb (66.7 kg)  08/29/20 147 lb 9.6 oz (67 kg)  11/01/19 152 lb (68.9 kg)     Health Maintenance Due  Topic Date Due   Zoster Vaccines- Shingrix (1 of 2) Never done   COVID-19 Vaccine (4 - Booster for Pfizer series) 08/05/2020   INFLUENZA VACCINE  02/19/2021    There are no preventive care reminders to display for this patient.  Lab Results  Component Value Date   TSH 2.470 07/10/2017   Lab Results  Component Value Date   WBC 4.5 11/01/2019   HGB 14.6 11/01/2019   HCT 45.1 11/01/2019   MCV 87.4 11/01/2019   PLT 213 11/01/2019   Lab Results  Component Value Date   NA 136 11/01/2019   K 4.1 11/01/2019   CO2 24 11/01/2019   GLUCOSE 84 11/01/2019   BUN 15 11/01/2019   CREATININE 0.78 11/01/2019   BILITOT 0.4 07/10/2017   ALKPHOS 68 07/10/2017   AST 17 07/10/2017   ALT 10 07/10/2017   PROT 6.9 07/10/2017   ALBUMIN 4.3  07/10/2017   CALCIUM 8.9 11/01/2019   ANIONGAP 11 11/01/2019   Lab  Results  Component Value Date   CHOL 153 01/16/2021   Lab Results  Component Value Date   HDL 47.20 01/16/2021   Lab Results  Component Value Date   LDLCALC 93 01/16/2021   Lab Results  Component Value Date   TRIG 64.0 01/16/2021   Lab Results  Component Value Date   CHOLHDL 3 01/16/2021   No results found for: HGBA1C    Assessment & Plan:   Problem List Items Addressed This Visit   None Visit Diagnoses     Acute pain of right knee    -  Primary   Relevant Medications   diclofenac (VOLTAREN) 75 MG EC tablet   Other Relevant Orders   DG Knee Complete 4 Views Right       Meds ordered this encounter  Medications   diclofenac (VOLTAREN) 75 MG EC tablet    Sig: Take 1 tablet (75 mg total) by mouth 2 (two) times daily.    Dispense:  30 tablet    Refill:  0    Order Specific Question:   Supervising Provider    Answer:   Carlota Raspberry, JEFFREY R [[id number]]    Follow-up: No follow-ups on file.   PLAN Will order DG knee. Suspect OA given no acute injury Diclofenac sent. Ok to pair with tylenol. Discussed RICE for relief. Can consider PT, ortho if worsening or failing to improve Patient encouraged to call clinic with any questions, comments, or concerns.  Maximiano Coss, NP

## 2022-11-18 ENCOUNTER — Other Ambulatory Visit: Payer: Self-pay

## 2022-11-18 ENCOUNTER — Emergency Department (HOSPITAL_COMMUNITY)
Admission: EM | Admit: 2022-11-18 | Discharge: 2022-11-18 | Disposition: A | Discharge status: Home / Self Care | Payer: Commercial Managed Care - HMO | Attending: Emergency Medicine | Admitting: Emergency Medicine

## 2022-11-18 DIAGNOSIS — W228XXA Striking against or struck by other objects, initial encounter: Secondary | ICD-10-CM | POA: Diagnosis not present

## 2022-11-18 DIAGNOSIS — H5789 Other specified disorders of eye and adnexa: Secondary | ICD-10-CM | POA: Diagnosis present

## 2022-11-18 DIAGNOSIS — Y990 Civilian activity done for income or pay: Secondary | ICD-10-CM | POA: Diagnosis not present

## 2022-11-18 DIAGNOSIS — S0501XA Injury of conjunctiva and corneal abrasion without foreign body, right eye, initial encounter: Secondary | ICD-10-CM | POA: Insufficient documentation

## 2022-11-18 DIAGNOSIS — Y92007 Garden or yard of unspecified non-institutional (private) residence as the place of occurrence of the external cause: Secondary | ICD-10-CM | POA: Insufficient documentation

## 2022-11-18 MED ORDER — TETRACAINE HCL 0.5 % OP SOLN
2.0000 [drp] | Freq: Once | OPHTHALMIC | Status: AC
  Administered 2022-11-18: 2 [drp] via OPHTHALMIC
  Filled 2022-11-18: qty 4

## 2022-11-18 MED ORDER — FLUORESCEIN SODIUM 1 MG OP STRP
2.0000 | ORAL_STRIP | Freq: Once | OPHTHALMIC | Status: AC
  Administered 2022-11-18: 2 via OPHTHALMIC
  Filled 2022-11-18: qty 2

## 2022-11-18 MED ORDER — ERYTHROMYCIN 5 MG/GM OP OINT: TOPICAL_OINTMENT | OPHTHALMIC | 0 refills | Status: AC

## 2022-11-18 NOTE — Discharge Instructions (Addendum)
I have sent the erythromycin ointment into the pharmacy for you.  You likely have corneal abrasion.  No foreign body noted on exam.  For any concerning symptoms return to the emergency department otherwise follow-up with your primary care provider.  If your symptoms do not improve have also attached information for ophthalmology.

## 2022-11-18 NOTE — ED Triage Notes (Signed)
Pt states that he was working in his back yard today when something hit his R eye. Pt c/o R eye irritation, pain and itching. Feels like there is still something in his eye. Denies visual changes.

## 2022-11-18 NOTE — ED Provider Notes (Signed)
[place] EMERGENCY DEPARTMENT AT [facility] Provider Note   CSN: [id number] Arrival date & time: 11/18/22  2005     History  Chief Complaint  Patient presents with   Eye Problem    Jay Osborn is a 62 y.o. male.  62 year old male presents today for concern of right eye irritation.  He states this started around 7 PM when he was working in the backyard and he feels something flew into his eye.  Denies any vision change.  Denies any other complaints.  The history is provided by the patient. No language interpreter was used.       Home Medications Prior to Admission medications   Medication Sig Start Date End Date Taking? Authorizing Provider  erythromycin ophthalmic ointment Place a 1/2 inch ribbon of ointment into the lower eyelid. 11/18/22  Yes Braelin Brosch, PA-C  albuterol (PROVENTIL HFA;VENTOLIN HFA) 108 (90 Base) MCG/ACT inhaler Inhale 1-2 puffs into the lungs every 4 (four) hours as needed for wheezing or shortness of breath. 09/08/18   Shade Flood, MD  clobetasol cream (TEMOVATE) 0.05 % Apply 1 application topically 2 (two) times daily. 01/16/21   Etta Grandchild, MD  diclofenac (VOLTAREN) 75 MG EC tablet Take 1 tablet (75 mg total) by mouth 2 (two) times daily. 08/29/20   Janeece Agee, NP  gabapentin (NEURONTIN) 100 MG capsule Take 1 capsule (100 mg total) by mouth 2 (two) times daily. 10/30/20   Ward, Tylene Fantasia, PA-C  VITAMIN D PO Take 1 capsule by mouth daily.    [provider]      Allergies    Patient has no known allergies.    Review of Systems   Review of Systems  Constitutional:  Negative for chills and fever.  Eyes:  Positive for pain. Negative for photophobia, discharge, redness, itching and visual disturbance.  All other systems reviewed and are negative.   Physical Exam Updated Vital Signs BP 129/78 (BP Location: Right Arm)   Pulse 64   Temp 98.1 F (36.7 C) (Oral)   Resp 16   Ht 5\' 9"  (1.753 m)   Wt 68.9 kg   SpO2  100%   BMI 22.45 kg/m  Physical Exam Vitals and nursing note reviewed.  Constitutional:      General: He is not in acute distress.    Appearance: Normal appearance. He is not ill-appearing.  HENT:     Head: Normocephalic and atraumatic.     Nose: Nose normal.  Eyes:     General: Lids are normal. Lids are everted, no foreign bodies appreciated. Vision grossly intact. Gaze aligned appropriately.        Right eye: No foreign body.        Left eye: No foreign body.     Extraocular Movements: Extraocular movements intact.     Right eye: Normal extraocular motion.     Left eye: Normal extraocular motion.     Conjunctiva/sclera: Conjunctivae normal.     Pupils:     Right eye: Fluorescein uptake present.  Pulmonary:     Effort: Pulmonary effort is normal. No respiratory distress.  Musculoskeletal:        General: No deformity.  Skin:    Findings: No rash.  Neurological:     Mental Status: He is alert.     ED Results / Procedures / Treatments   Labs (all labs ordered are listed, but only abnormal results are displayed) Labs Reviewed - No data to display  EKG None  Radiology No results found.  Procedures Procedures    Medications Ordered in ED Medications  fluorescein ophthalmic strip 2 strip (2 strips Right Eye Given by Other 11/18/22 2146)  tetracaine (PONTOCAINE) 0.5 % ophthalmic solution 2 drop (2 drops Right Eye Given by Other 11/18/22 2146)    ED Course/ Medical Decision Making/ A&P                             Medical Decision Making Risk Prescription drug management.   62 year old male presents for right eye irritation.  He feels something flew into his eye.  He does not wear contacts.  Vision grossly intact.  Minimal fluorescein uptake noted at about 12 o'clock position.  No foreign body noted on exam.  Q-tip applicator was used to check on in the thiazides to ensure there is no foreign body there.  Will prescribe erythromycin eye ointment.  Optho referral  given.  Patient voices understanding and is in agreement with plan.   Final Clinical Impression(s) / ED Diagnoses Final diagnoses:  Abrasion of right cornea, initial encounter    Rx / DC Orders ED Discharge Orders          Ordered    erythromycin ophthalmic ointment        11/18/22 2246              Marita Kansas, PA-C 11/18/22 2251    Virgina Norfolk, DO 11/18/22 2256

## 2022-11-19 ENCOUNTER — Telehealth: Payer: Self-pay

## 2022-11-19 NOTE — Transitions of Care (Post Inpatient/ED Visit) (Signed)
   11/19/2022  Name: Yardley Lekas MRN: [id number] DOB: [birth year]  Today's TOC FU Call Status: Today's TOC FU Call Status:: Successful TOC FU Call Competed TOC FU Call Complete Date: 11/19/22  Transition Care Management Follow-up Telephone Call Date of Discharge: 11/18/22 Discharge Facility: [facility]) Type of Discharge: Emergency Department Reason for ED Visit: Other: (corneal abrasion) How have you been since you were released from the hospital?: Better Any questions or concerns?: No  Items Reviewed: Did you receive and understand the discharge instructions provided?: Yes Medications obtained and verified?: Yes (Medications Reviewed) Any new allergies since your discharge?: No Dietary orders reviewed?: Yes Do you have support at home?: Yes People in Home: spouse  Home Care and Equipment/Supplies: Were Home Health Services Ordered?: NA Any new equipment or medical supplies ordered?: NA  Functional Questionnaire: Do you need assistance with bathing/showering or dressing?: No Do you need assistance with meal preparation?: No Do you need assistance with eating?: No Do you have difficulty maintaining continence: No Do you need assistance with getting out of bed/getting out of a chair/moving?: No Do you have difficulty managing or taking your medications?: No  Follow up appointments reviewed: PCP Follow-up appointment confirmed?: NA Specialist Hospital Follow-up appointment confirmed?: No Reason Specialist Follow-Up Not Confirmed: Patient has Specialist Provider Number and will Call for Appointment Do you need transportation to your follow-up appointment?: No Do you understand care options if your condition(s) worsen?: Yes-patient verbalized understanding    SIGNATURE Karena Addison, LPN [facility] Nurse Health Advisor Direct Dial [phone]

## 2023-03-22 IMAGING — CR DG KNEE 1-2V*R*
2 series · 2 of 2 positions shown · non-contrast
Comparison: None.

CLINICAL DATA: Right knee pain and swelling.

EXAM:
RIGHT KNEE - 1-2 VIEW

[w knee ap right]
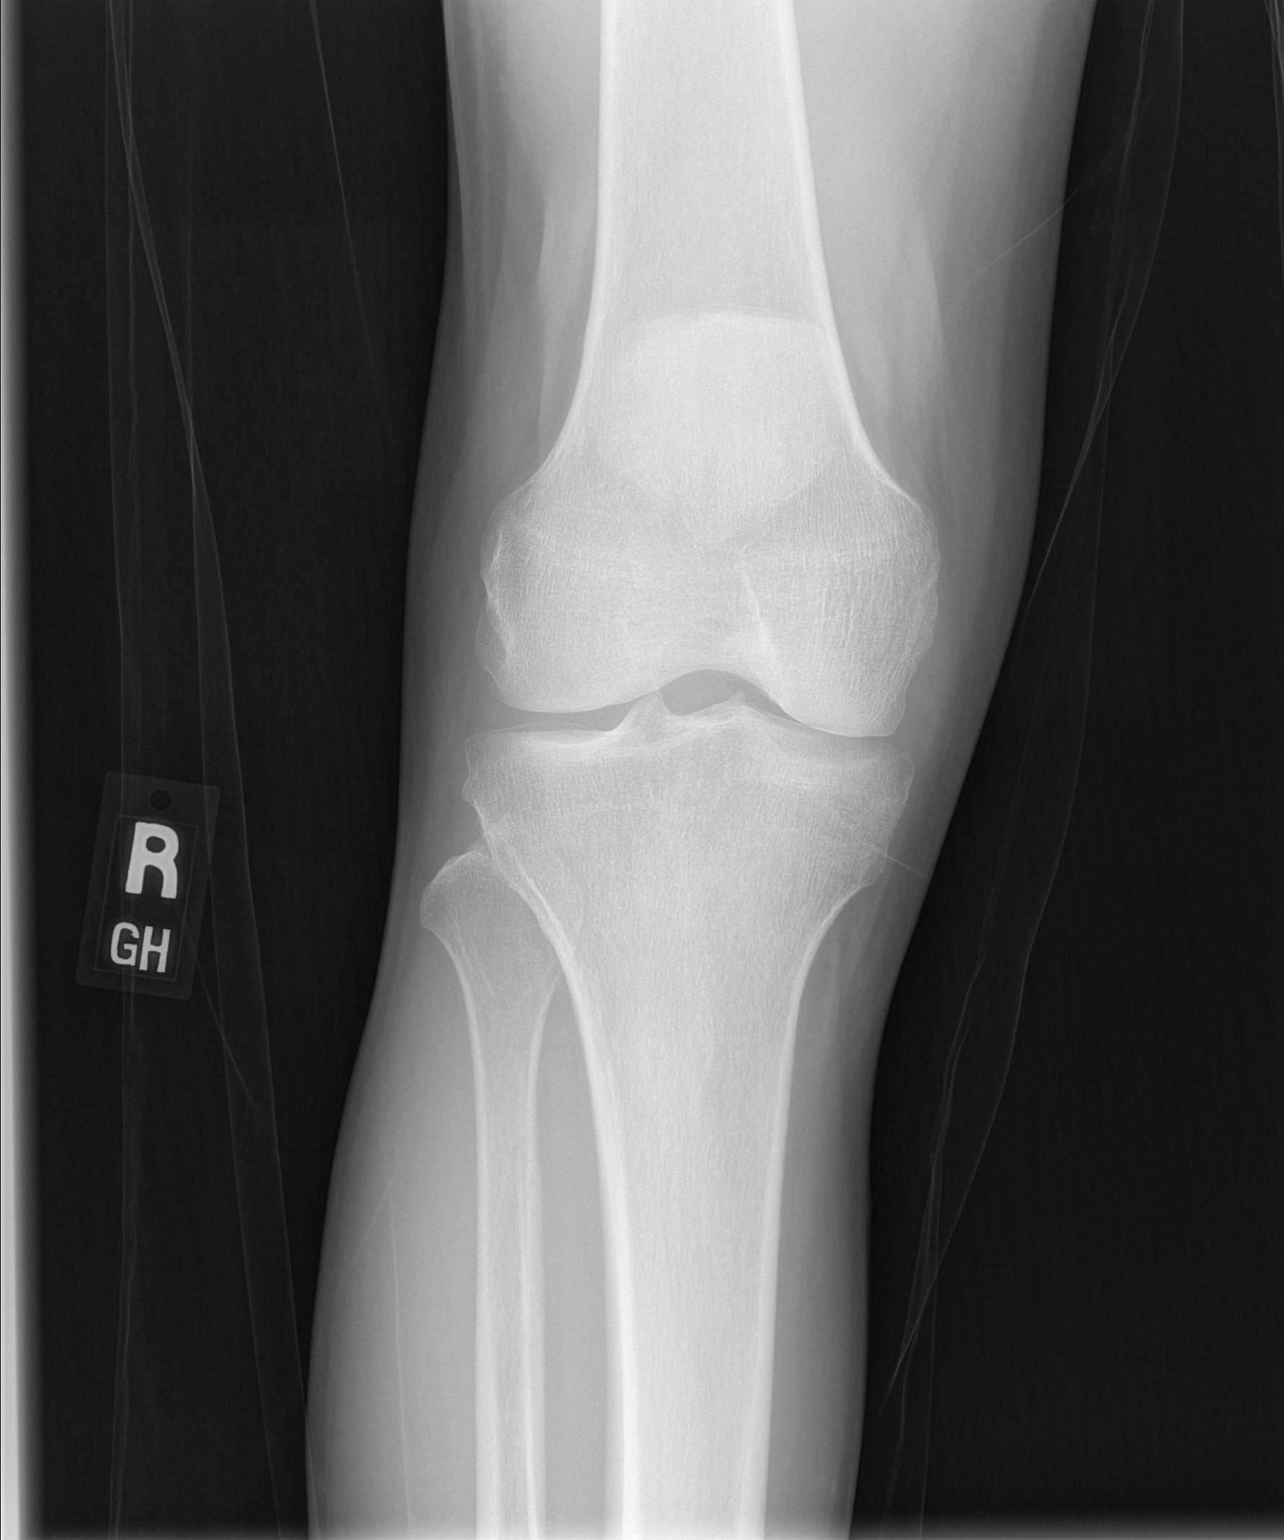

[w knee lat. right]
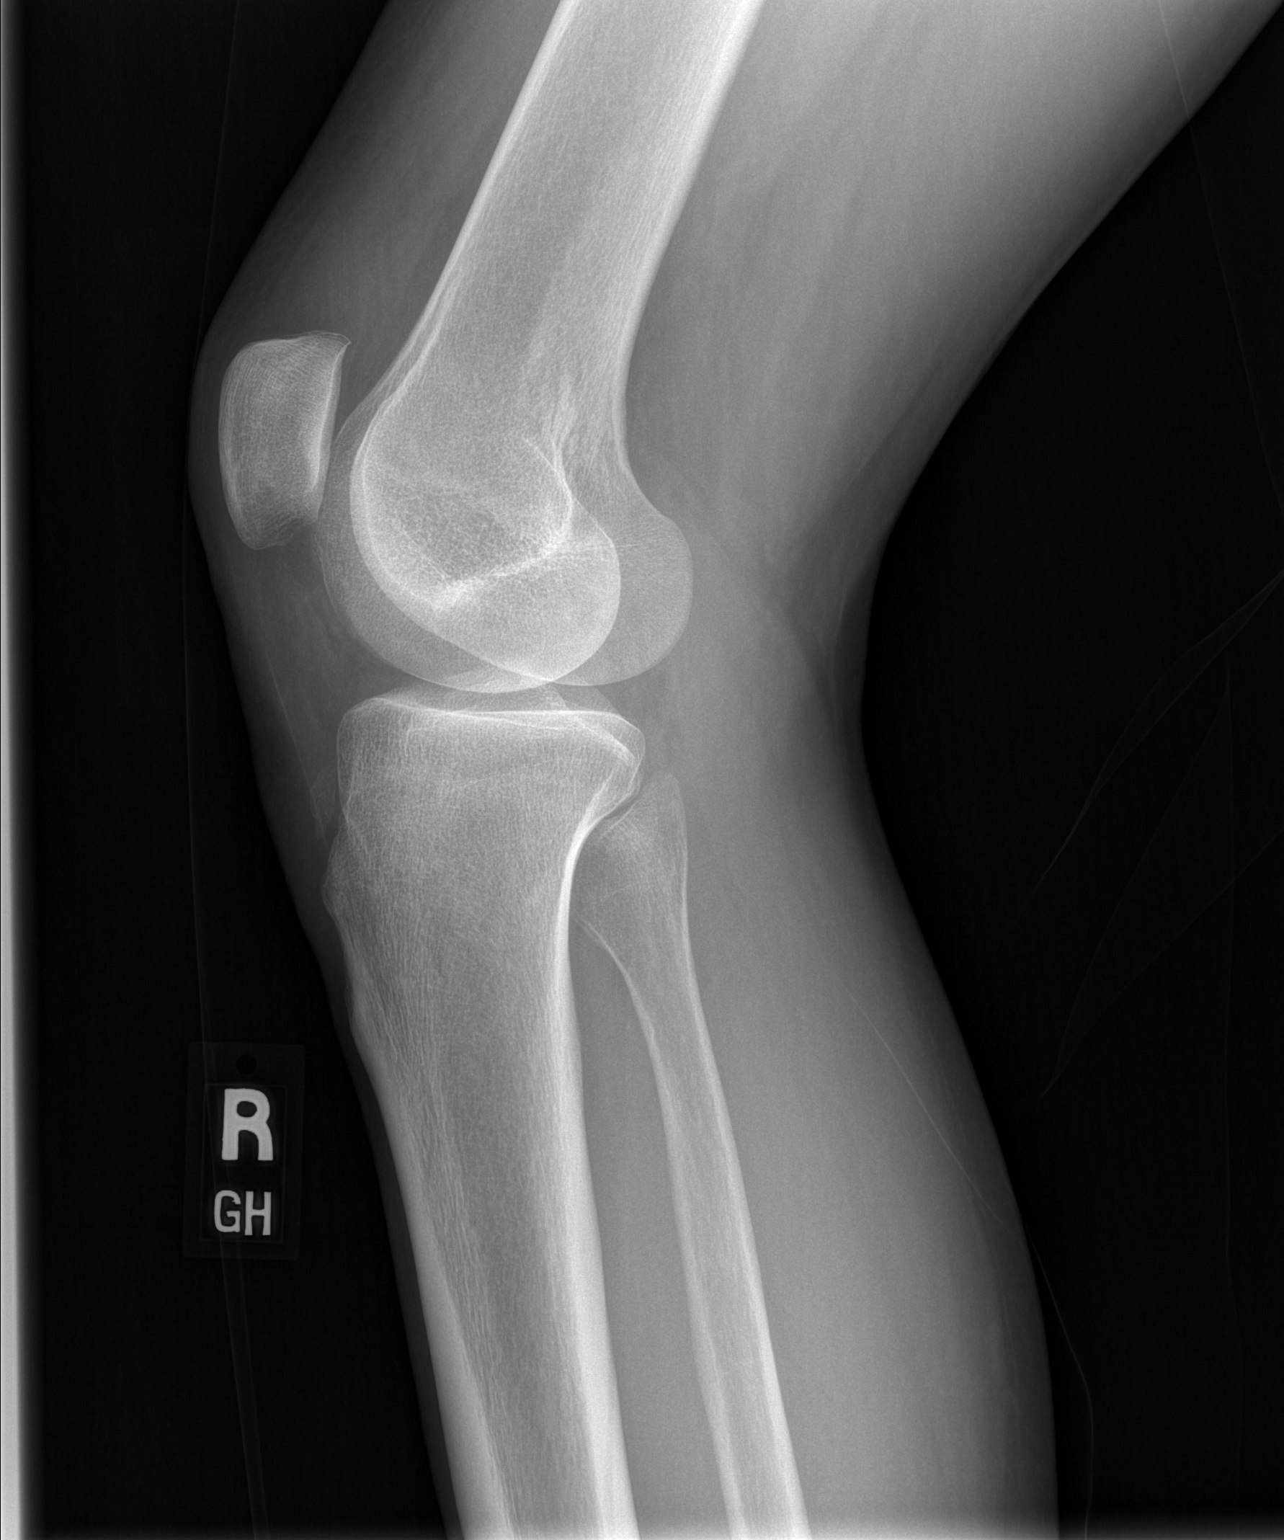

[2 of 2 positions shown; findings below may reference images not displayed]

FINDINGS: There is no acute fracture or dislocation. The bones are well
mineralized. No significant arthritic changes or joint effusion. The
soft tissues are unremarkable.
IMPRESSION: Negative.

## 2023-03-23 ENCOUNTER — Emergency Department (HOSPITAL_BASED_OUTPATIENT_CLINIC_OR_DEPARTMENT_OTHER)
Admission: EM | Admit: 2023-03-23 | Discharge: 2023-03-23 | Disposition: A | Discharge status: Home / Self Care | Payer: Commercial Managed Care - HMO | Source: Home / Self Care | Attending: Emergency Medicine | Admitting: Emergency Medicine

## 2023-03-23 ENCOUNTER — Other Ambulatory Visit: Payer: Self-pay

## 2023-03-23 DIAGNOSIS — H1031 Unspecified acute conjunctivitis, right eye: Secondary | ICD-10-CM | POA: Insufficient documentation

## 2023-03-23 DIAGNOSIS — H10211 Acute toxic conjunctivitis, right eye: Secondary | ICD-10-CM

## 2023-03-23 DIAGNOSIS — H5789 Other specified disorders of eye and adnexa: Secondary | ICD-10-CM | POA: Diagnosis present

## 2023-03-23 MED ORDER — FLUORESCEIN SODIUM 1 MG OP STRP: 1.0000 | ORAL_STRIP | Freq: Once | OPHTHALMIC | Status: AC

## 2023-03-23 MED ORDER — FLUORESCEIN SODIUM 1 MG OP STRP
ORAL_STRIP | OPHTHALMIC | Status: AC
  Administered 2023-03-23: 1 via OPHTHALMIC
  Filled 2023-03-23: qty 1

## 2023-03-23 MED ORDER — TETRACAINE HCL 0.5 % OP SOLN
2.0000 [drp] | Freq: Once | OPHTHALMIC | Status: AC
  Administered 2023-03-23: 2 [drp] via OPHTHALMIC

## 2023-03-23 MED ORDER — ERYTHROMYCIN 5 MG/GM OP OINT
TOPICAL_OINTMENT | Freq: Once | OPHTHALMIC | Status: AC
  Administered 2023-03-23: 1 via OPHTHALMIC
  Filled 2023-03-23: qty 3.5

## 2023-03-23 NOTE — ED Triage Notes (Signed)
R eye pain and "sandpaper" sensation after getting drain cleaner in eye at 12a tonight.

## 2023-03-23 NOTE — ED Provider Notes (Signed)
[place] EMERGENCY DEPARTMENT AT [facility]  Provider Note  CSN: [id number] Arrival date & time: 03/23/23 0417  History Chief Complaint  Patient presents with   Eye Problem    Jay Osborn is a 62 y.o. male who does not wear glasses or contacts reports he was trying to clean out a clogged sink using a lye drain cleaner when the contents of the sink (including the cleaner) splashed into his R eye about 4 hours prior to arrival. He splashed some water in the eye but since then has had a sandpaper sensation and some mild blurred vision.    Home Medications Prior to Admission medications   Medication Sig Start Date End Date Taking? Authorizing Provider  albuterol (PROVENTIL HFA;VENTOLIN HFA) 108 (90 Base) MCG/ACT inhaler Inhale 1-2 puffs into the lungs every 4 (four) hours as needed for wheezing or shortness of breath. 09/08/18   Shade Flood, MD  clobetasol cream (TEMOVATE) 0.05 % Apply 1 application topically 2 (two) times daily. 01/16/21   Etta Grandchild, MD  diclofenac (VOLTAREN) 75 MG EC tablet Take 1 tablet (75 mg total) by mouth 2 (two) times daily. 08/29/20   Janeece Agee, NP  erythromycin ophthalmic ointment Place a 1/2 inch ribbon of ointment into the lower eyelid. 11/18/22   Karie Mainland, Amjad, PA-C  gabapentin (NEURONTIN) 100 MG capsule Take 1 capsule (100 mg total) by mouth 2 (two) times daily. 10/30/20   Ward, Tylene Fantasia, PA-C  VITAMIN D PO Take 1 capsule by mouth daily.    [provider]     Allergies    Patient has no known allergies.   Review of Systems   Review of Systems Please see HPI for pertinent positives and negatives  Physical Exam BP 125/75 (BP Location: Right Arm)   Pulse 60   Temp 98 F (36.7 C)   Resp 18   SpO2 99%   Physical Exam Vitals and nursing note reviewed.  HENT:     Head: Normocephalic.     Nose: Nose normal.  Eyes:     Comments: Both eyes irrigated copiously at eye wash station prior to my evaluation, R eye with  moderate conjunctival injection, anterior chamber is clear. pH is neutral. No fluorescein uptake on cornea. Visual acuity is mildly decreased in R eye compared to L  Pulmonary:     Effort: Pulmonary effort is normal.  Musculoskeletal:        General: Normal range of motion.     Cervical back: Neck supple.  Skin:    Findings: No rash (on exposed skin).  Neurological:     Mental Status: He is alert and oriented to person, place, and time.  Psychiatric:        Mood and Affect: Mood normal.     ED Results / Procedures / Treatments   EKG None  Procedures Procedures  Medications Ordered in the ED Medications  tetracaine (PONTOCAINE) 0.5 % ophthalmic solution 2 drop (2 drops Right Eye Given by Other 03/23/23 0501)  fluorescein ophthalmic strip 1 strip (1 strip Right Eye Given by Other 03/23/23 0501)  erythromycin ophthalmic ointment (1 Application Right Eye Given 03/23/23 0517)    Initial Impression and Plan  Patient here with alkali exposure to R eye. pH is currently neutral after irrigation. Anterior chamber and cornea without concerning findings.   ED Course   Clinical Course as of 03/23/23 7425  Wynelle Link Mar 23, 2023  0619 Spoke with Dr. Genia Del, Ophthalmology who will see the  patient in his office on Tuesday. Recommends erythromycin ointment in the meantime.  [CS]    Clinical Course User Index [CS] Pollyann Savoy, MD     MDM Rules/Calculators/A&P Medical Decision Making Problems Addressed: Chemical conjunctivitis of right eye: acute illness or injury  Risk Prescription drug management.     Final Clinical Impression(s) / ED Diagnoses Final diagnoses:  Chemical conjunctivitis of right eye    Rx / DC Orders ED Discharge Orders     None        Pollyann Savoy, MD 03/23/23 [phone]

## 2023-03-23 NOTE — Discharge Instructions (Signed)
Please continue to use the erythromycin ointment four times a day while awake until your follow up appointment.

## 2023-03-23 NOTE — ED Notes (Addendum)
Ensured pt has erythromycin ophthalmic ointment to use per dc instructions. He is aware of need to f/u with ophthalmologist. Patient verbalizes understanding of discharge instructions. Opportunity for questioning and answers were provided. Armband removed by staff, pt discharged from ED to home via POV.

## 2023-03-23 NOTE — ED Notes (Signed)
Eyes cleansed at eyewash station

## 2023-09-04 ENCOUNTER — Encounter (INDEPENDENT_AMBULATORY_CARE_PROVIDER_SITE_OTHER): Payer: Self-pay | Admitting: Otolaryngology

## 2023-11-27 ENCOUNTER — Encounter (INDEPENDENT_AMBULATORY_CARE_PROVIDER_SITE_OTHER): Payer: Self-pay

## 2023-11-27 ENCOUNTER — Institutional Professional Consult (permissible substitution) (INDEPENDENT_AMBULATORY_CARE_PROVIDER_SITE_OTHER): Payer: Commercial Managed Care - HMO | Admitting: Otolaryngology

## 2023-11-27 NOTE — Progress Notes (Deleted)
 Dear Dr. Miki Alert, Here is my assessment for our mutual patient, Jay Osborn. Thank you for allowing me the opportunity to care for your patient. Please do not hesitate to contact me should you have any other questions. Sincerely, Dr. Milon Aloe  Otolaryngology Clinic Note Referring provider: Dr. Miki Alert HPI:  Jay Osborn is a 63 y.o. male kindly referred by Dr. Miki Alert for evaluation of ***.   H&N Surgery: *** Personal or FHx of bleeding dz or anesthesia difficulty: no ***  GLP-1: *** AP/AC: ***  Tobacco: former, quit. Alcohol: ***. Occupation: ***. Lives in *** with ***.  Independent Review of Additional Tests or Records:  Dru[facility]) Referral notes reviewed and uploaded or available in chart in media tab (08/26/2023): whistling sound in right ear, worse when trying to sleep; right ear also itches; some rhinorrhea past few days; Dx: ETD; Rx: Flonase CBC and BMP 11/01/2019: WBC 4.5, Plt 213; BUN/Cr 15/0.78   PMH/Meds/All/SocHx/FamHx/ROS:   Past Medical History:  Diagnosis Date   Allergy    Arthritis    Autoimmune disease (HCC)    pt reports being told has autoimmune disease, unsure of name     Past Surgical History:  Procedure Laterality Date   APPENDECTOMY     LAPAROSCOPIC APPENDECTOMY N/A 10/18/2015   Procedure: APPENDECTOMY LAPAROSCOPIC;  Surgeon: Jacolyn Matar, MD;  Location: [facility] ORS;  Service: General;  Laterality: N/A;   NECK SURGERY     SPINE SURGERY      Family History  Problem Relation Age of Onset   Heart attack Mother 74   Cancer Father        prostate   Sudden death Cousin      Social Connections: Not on file      Current Outpatient Medications:    albuterol  (PROVENTIL  HFA;VENTOLIN  HFA) 108 (90 Base) MCG/ACT inhaler, Inhale 1-2 puffs into the lungs every 4 (four) hours as needed for wheezing or shortness of breath., Disp: 1 Inhaler, Rfl: 0   clobetasol  cream (TEMOVATE ) 0.05 %, Apply 1 application topically 2 (two) times daily., Disp: 60 g,  Rfl: 2   diclofenac  (VOLTAREN ) 75 MG EC tablet, Take 1 tablet (75 mg total) by mouth 2 (two) times daily., Disp: 30 tablet, Rfl: 0   erythromycin  ophthalmic ointment, Place a 1/2 inch ribbon of ointment into the lower eyelid., Disp: 3.5 g, Rfl: 0   gabapentin  (NEURONTIN ) 100 MG capsule, Take 1 capsule (100 mg total) by mouth 2 (two) times daily., Disp: 60 capsule, Rfl: 0   VITAMIN D PO, Take 1 capsule by mouth daily., Disp: , Rfl:    Physical Exam:   There were no vitals taken for this visit.  Salient findings:  CN II-XII intact *** Bilateral EAC clear and TM intact with well pneumatized middle ear spaces Weber 512: *** Rinne 512: AC > BC b/l *** Rine 1024: AC > BC b/l *** Anterior rhinoscopy: Septum ***; bilateral inferior turbinates with *** No lesions of oral cavity/oropharynx; dentition *** No obviously palpable neck masses/lymphadenopathy/thyromegaly No respiratory distress or stridor***  Seprately Identifiable Procedures:  Prior to initiating any procedures, risks/benefits/alternatives were explained to the patient and verbal consent obtained. None***  Impression & Plans:  Jay Osborn is a 63 y.o. male with ***  No diagnosis found.   - f/u ***  See below regarding exact medications prescribed this encounter including dosages and route: No orders of the defined types were placed in this encounter.     Thank you for allowing me the opportunity  to care for your patient. Please do not hesitate to contact me should you have any other questions.  Sincerely, Milon Aloe, MD Otolaryngologist (ENT), [facility] Phone: [phone] Fax: [phone]  11/27/2023, 7:51 AM   MDM:  Level *** Complexity/Problems addressed: *** Data complexity: *** independent review of *** - Morbidity: ***  - Prescription Drug prescribed or managed: ***
